# Patient Record
Sex: Female | Born: 1990 | Race: Black or African American | Hispanic: No | Marital: Single | Smoking: Never smoker
Health system: Southern US, Community
[De-identification: ages and names within clinical notes are randomized; demographics above are authoritative.]

## PROBLEM LIST (undated history)

## (undated) DIAGNOSIS — I38 Endocarditis, valve unspecified: Secondary | ICD-10-CM

## (undated) HISTORY — PX: CHOLECYSTECTOMY: SHX55

---
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
## 2008-04-03 ENCOUNTER — Observation Stay: Payer: Self-pay | Admitting: Obstetrics and Gynecology

## 2008-05-31 ENCOUNTER — Encounter: Payer: Self-pay | Admitting: Maternal & Fetal Medicine

## 2008-06-07 ENCOUNTER — Encounter: Payer: Self-pay | Admitting: Maternal & Fetal Medicine

## 2008-06-09 ENCOUNTER — Inpatient Hospital Stay: Payer: Self-pay | Admitting: Obstetrics and Gynecology

## 2008-09-22 ENCOUNTER — Emergency Department: Payer: Self-pay | Admitting: Emergency Medicine

## 2009-07-06 ENCOUNTER — Observation Stay: Payer: Self-pay

## 2009-07-14 ENCOUNTER — Encounter: Payer: Self-pay | Admitting: Obstetrics & Gynecology

## 2009-08-25 ENCOUNTER — Encounter: Payer: Self-pay | Admitting: Obstetrics & Gynecology

## 2009-09-23 ENCOUNTER — Encounter: Payer: Self-pay | Admitting: Obstetrics & Gynecology

## 2009-11-09 ENCOUNTER — Ambulatory Visit: Payer: Self-pay | Admitting: Obstetrics & Gynecology

## 2009-11-10 ENCOUNTER — Inpatient Hospital Stay: Payer: Self-pay

## 2012-07-17 ENCOUNTER — Emergency Department: Payer: Self-pay | Admitting: Emergency Medicine

## 2012-07-17 LAB — CBC
HGB: 13.1 g/dL (ref 12.0–16.0)
MCH: 30 pg (ref 26.0–34.0)
MCV: 89 fL (ref 80–100)
Platelet: 300 10*3/uL (ref 150–440)
RBC: 4.38 10*6/uL (ref 3.80–5.20)
RDW: 13.3 % (ref 11.5–14.5)
WBC: 8.8 10*3/uL (ref 3.6–11.0)

## 2012-07-17 LAB — URINALYSIS, COMPLETE
Bilirubin,UR: NEGATIVE
Ketone: NEGATIVE
Nitrite: POSITIVE
Protein: 30
Specific Gravity: 1.02 (ref 1.003–1.030)
WBC UR: 19 /HPF (ref 0–5)

## 2012-07-17 LAB — BASIC METABOLIC PANEL
BUN: 5 mg/dL — ABNORMAL LOW (ref 7–18)
Chloride: 109 mmol/L — ABNORMAL HIGH (ref 98–107)
Creatinine: 0.82 mg/dL (ref 0.60–1.30)
EGFR (African American): >60
Glucose: 94 mg/dL (ref 65–99)
Potassium: 3.6 mmol/L (ref 3.5–5.1)
Sodium: 144 mmol/L (ref 136–145)

## 2012-07-17 LAB — PREGNANCY, URINE: Pregnancy Test, Urine: NEGATIVE m[IU]/mL

## 2013-03-24 ENCOUNTER — Inpatient Hospital Stay: Payer: Self-pay | Admitting: Surgery

## 2013-03-24 LAB — CBC
HCT: 36.4 % (ref 35.0–47.0)
HGB: 12.2 g/dL (ref 12.0–16.0)
MCH: 30.1 pg (ref 26.0–34.0)
MCHC: 33.5 g/dL (ref 32.0–36.0)
MCV: 90 fL (ref 80–100)
RBC: 4.05 10*6/uL (ref 3.80–5.20)
RDW: 13.4 % (ref 11.5–14.5)

## 2013-03-24 LAB — BASIC METABOLIC PANEL
Anion Gap: 9 (ref 7–16)
BUN: 8 mg/dL (ref 7–18)
Calcium, Total: 8.3 mg/dL — ABNORMAL LOW (ref 8.5–10.1)
Co2: 25 mmol/L (ref 21–32)
Creatinine: 0.7 mg/dL (ref 0.60–1.30)
EGFR (African American): >60
Osmolality: 280 (ref 275–301)

## 2013-03-24 LAB — URINALYSIS, COMPLETE
Bilirubin,UR: NEGATIVE
Ketone: NEGATIVE
Protein: 30
Squamous Epithelial: 2
WBC UR: 20 /HPF (ref 0–5)

## 2013-03-24 LAB — HEPATIC FUNCTION PANEL A (ARMC)
Bilirubin, Direct: <0.1 mg/dL (ref 0.00–0.20)
Bilirubin,Total: 0.2 mg/dL (ref 0.2–1.0)
SGPT (ALT): 21 U/L (ref 12–78)
Total Protein: 7.1 g/dL (ref 6.4–8.2)

## 2013-03-24 LAB — TROPONIN I: Troponin-I: <0.02 ng/mL

## 2013-03-25 LAB — CBC WITH DIFFERENTIAL/PLATELET
HCT: 34.2 % — ABNORMAL LOW (ref 35.0–47.0)
HGB: 11.3 g/dL — ABNORMAL LOW (ref 12.0–16.0)
Lymphocyte #: 2.8 10*3/uL (ref 1.0–3.6)
Lymphocyte %: 38.1 %
MCH: 30 pg (ref 26.0–34.0)
MCV: 91 fL (ref 80–100)
Monocyte %: 6.9 %
Neutrophil #: 4 10*3/uL (ref 1.4–6.5)
Platelet: 294 10*3/uL (ref 150–440)
RBC: 3.76 10*6/uL — ABNORMAL LOW (ref 3.80–5.20)
RDW: 13 % (ref 11.5–14.5)
WBC: 7.5 10*3/uL (ref 3.6–11.0)

## 2013-03-25 LAB — COMPREHENSIVE METABOLIC PANEL
Albumin: 2.8 g/dL — ABNORMAL LOW (ref 3.4–5.0)
Anion Gap: 8 (ref 7–16)
BUN: 5 mg/dL — ABNORMAL LOW (ref 7–18)
Bilirubin,Total: 0.4 mg/dL (ref 0.2–1.0)
Calcium, Total: 8.2 mg/dL — ABNORMAL LOW (ref 8.5–10.1)
Co2: 26 mmol/L (ref 21–32)
Creatinine: 0.84 mg/dL (ref 0.60–1.30)
Osmolality: 276 (ref 275–301)
SGOT(AST): 20 U/L (ref 15–37)
SGPT (ALT): 22 U/L (ref 12–78)
Sodium: 140 mmol/L (ref 136–145)
Total Protein: 6.2 g/dL — ABNORMAL LOW (ref 6.4–8.2)

## 2013-03-25 LAB — PATHOLOGY REPORT

## 2013-05-02 ENCOUNTER — Emergency Department: Payer: Self-pay | Admitting: Emergency Medicine

## 2013-05-02 LAB — URINALYSIS, COMPLETE
Bacteria: NONE SEEN
Bilirubin,UR: NEGATIVE
Nitrite: NEGATIVE
Protein: 100
RBC,UR: 4792 /HPF (ref 0–5)
Specific Gravity: 1.023 (ref 1.003–1.030)
WBC UR: NONE SEEN /HPF (ref 0–5)

## 2013-05-02 LAB — CBC
HCT: 35.6 % (ref 35.0–47.0)
HGB: 12.2 g/dL (ref 12.0–16.0)
MCH: 30.2 pg (ref 26.0–34.0)
Platelet: 318 10*3/uL (ref 150–440)
WBC: 7.6 10*3/uL (ref 3.6–11.0)

## 2013-05-21 ENCOUNTER — Emergency Department: Payer: Self-pay | Admitting: Emergency Medicine

## 2013-05-21 LAB — COMPREHENSIVE METABOLIC PANEL
Albumin: 3.3 g/dL — ABNORMAL LOW (ref 3.4–5.0)
Alkaline Phosphatase: 59 U/L (ref 50–136)
Bilirubin,Total: 0.3 mg/dL (ref 0.2–1.0)
Co2: 26 mmol/L (ref 21–32)
Creatinine: 0.53 mg/dL — ABNORMAL LOW (ref 0.60–1.30)
EGFR (African American): >60
EGFR (Non-African Amer.): >60
Glucose: 87 mg/dL (ref 65–99)
Potassium: 3.6 mmol/L (ref 3.5–5.1)
SGPT (ALT): 28 U/L (ref 12–78)
Sodium: 136 mmol/L (ref 136–145)
Total Protein: 7.5 g/dL (ref 6.4–8.2)

## 2013-05-21 LAB — CBC
HCT: 36.7 % (ref 35.0–47.0)
MCH: 30.6 pg (ref 26.0–34.0)
MCHC: 34.5 g/dL (ref 32.0–36.0)
RBC: 4.14 10*6/uL (ref 3.80–5.20)

## 2013-05-21 LAB — URINALYSIS, COMPLETE
Blood: NEGATIVE
Glucose,UR: NEGATIVE mg/dL (ref 0–75)
Ketone: NEGATIVE
Protein: NEGATIVE
RBC,UR: 3 /HPF (ref 0–5)
Specific Gravity: 1.027 (ref 1.003–1.030)
Squamous Epithelial: 4
WBC UR: 5 /HPF (ref 0–5)

## 2013-05-21 LAB — HCG, QUANTITATIVE, PREGNANCY: Beta Hcg, Quant.: 91313 m[IU]/mL — ABNORMAL HIGH

## 2013-07-31 ENCOUNTER — Emergency Department: Payer: Self-pay | Admitting: Emergency Medicine

## 2013-07-31 LAB — COMPREHENSIVE METABOLIC PANEL
Alkaline Phosphatase: 75 U/L (ref 50–136)
Anion Gap: 6 — ABNORMAL LOW (ref 7–16)
Chloride: 107 mmol/L (ref 98–107)
Co2: 26 mmol/L (ref 21–32)
EGFR (African American): >60
EGFR (Non-African Amer.): >60
Glucose: 88 mg/dL (ref 65–99)
Osmolality: 275 (ref 275–301)
SGOT(AST): 29 U/L (ref 15–37)
Sodium: 139 mmol/L (ref 136–145)
Total Protein: 7 g/dL (ref 6.4–8.2)

## 2013-07-31 LAB — URINALYSIS, COMPLETE
Bilirubin,UR: NEGATIVE
Blood: NEGATIVE
Glucose,UR: NEGATIVE mg/dL (ref 0–75)
Ketone: NEGATIVE
Nitrite: NEGATIVE
Ph: 5 (ref 4.5–8.0)
Protein: 30
Specific Gravity: 1.029 (ref 1.003–1.030)
WBC UR: 5 /HPF (ref 0–5)

## 2013-07-31 LAB — CBC
HCT: 34.9 % — ABNORMAL LOW (ref 35.0–47.0)
HGB: 12.4 g/dL (ref 12.0–16.0)
MCV: 88 fL (ref 80–100)
RBC: 3.97 10*6/uL (ref 3.80–5.20)
RDW: 13.1 % (ref 11.5–14.5)
WBC: 9.8 10*3/uL (ref 3.6–11.0)

## 2013-07-31 LAB — WET PREP, GENITAL

## 2013-08-19 ENCOUNTER — Observation Stay: Payer: Self-pay

## 2013-09-29 ENCOUNTER — Observation Stay: Payer: Self-pay | Admitting: Obstetrics and Gynecology

## 2013-09-29 LAB — CBC
HCT: 33 % — ABNORMAL LOW (ref 35.0–47.0)
HGB: 11.7 g/dL — ABNORMAL LOW (ref 12.0–16.0)
MCV: 86 fL (ref 80–100)
Platelet: 281 10*3/uL (ref 150–440)
RBC: 3.83 10*6/uL (ref 3.80–5.20)
RDW: 12.9 % (ref 11.5–14.5)

## 2013-09-29 LAB — COMPREHENSIVE METABOLIC PANEL
Albumin: 2.7 g/dL — ABNORMAL LOW (ref 3.4–5.0)
Alkaline Phosphatase: 106 U/L (ref 50–136)
Anion Gap: 6 — ABNORMAL LOW (ref 7–16)
BUN: 3 mg/dL — ABNORMAL LOW (ref 7–18)
Bilirubin,Total: 0.4 mg/dL (ref 0.2–1.0)
Calcium, Total: 8.7 mg/dL (ref 8.5–10.1)
Chloride: 106 mmol/L (ref 98–107)
Co2: 23 mmol/L (ref 21–32)
EGFR (African American): >60
EGFR (Non-African Amer.): >60
Glucose: 81 mg/dL (ref 65–99)
Osmolality: 266 (ref 275–301)
SGOT(AST): 23 U/L (ref 15–37)
SGPT (ALT): 18 U/L (ref 12–78)
Total Protein: 6.6 g/dL (ref 6.4–8.2)

## 2013-09-29 LAB — URINALYSIS, COMPLETE
Bacteria: NONE SEEN
Bilirubin,UR: NEGATIVE
Blood: NEGATIVE
Glucose,UR: NEGATIVE mg/dL (ref 0–75)
RBC,UR: <1 /HPF (ref 0–5)
Specific Gravity: 1.002 (ref 1.003–1.030)
Squamous Epithelial: 1

## 2013-11-04 ENCOUNTER — Observation Stay: Payer: Self-pay

## 2013-11-04 LAB — URINALYSIS, COMPLETE
Blood: NEGATIVE
Protein: NEGATIVE
RBC,UR: NONE SEEN /HPF (ref 0–5)
Specific Gravity: 1.002 (ref 1.003–1.030)
WBC UR: <1 /HPF (ref 0–5)

## 2013-11-18 ENCOUNTER — Observation Stay: Payer: Self-pay

## 2013-11-18 LAB — URINALYSIS, COMPLETE
Bacteria: NONE SEEN
Glucose,UR: NEGATIVE mg/dL (ref 0–75)
Ketone: NEGATIVE
Leukocyte Esterase: NEGATIVE
Nitrite: NEGATIVE
Ph: 7 (ref 4.5–8.0)
Protein: NEGATIVE
Specific Gravity: 1.009 (ref 1.003–1.030)

## 2013-12-10 ENCOUNTER — Observation Stay: Payer: Self-pay | Admitting: Obstetrics and Gynecology

## 2013-12-16 ENCOUNTER — Observation Stay: Payer: Self-pay

## 2013-12-16 ENCOUNTER — Ambulatory Visit: Payer: Self-pay | Admitting: Obstetrics and Gynecology

## 2013-12-16 LAB — CBC WITH DIFFERENTIAL/PLATELET
Basophil #: 0 10*3/uL (ref 0.0–0.1)
Basophil %: 0.4 %
Eosinophil #: 0 10*3/uL (ref 0.0–0.7)
HGB: 11.7 g/dL — ABNORMAL LOW (ref 12.0–16.0)
Lymphocyte #: 1.4 10*3/uL (ref 1.0–3.6)
Lymphocyte %: 19.6 %
MCV: 83 fL (ref 80–100)
Monocyte #: 0.6 x10 3/mm (ref 0.2–0.9)
Monocyte %: 7.6 %
Neutrophil %: 71.8 %

## 2013-12-17 ENCOUNTER — Inpatient Hospital Stay: Payer: Self-pay | Admitting: Obstetrics and Gynecology

## 2013-12-18 LAB — HEMATOCRIT
HCT: 19.5 % — ABNORMAL LOW (ref 35.0–47.0)
HCT: 26.3 % — ABNORMAL LOW (ref 35.0–47.0)

## 2013-12-19 LAB — CBC
MCH: 28.5 pg (ref 26.0–34.0)
WBC: 12.9 10*3/uL — ABNORMAL HIGH (ref 3.6–11.0)

## 2013-12-20 LAB — GC/CHLAMYDIA PROBE AMP

## 2013-12-24 HISTORY — PX: EXPLORATORY LAPAROTOMY: SUR591

## 2014-01-04 LAB — CBC WITH DIFFERENTIAL/PLATELET
BASOS PCT: 0.4 %
Basophil #: 0.1 10*3/uL (ref 0.0–0.1)
Eosinophil #: 0 10*3/uL (ref 0.0–0.7)
Eosinophil %: 0.1 %
HCT: 29 % — ABNORMAL LOW (ref 35.0–47.0)
HGB: 9.5 g/dL — AB (ref 12.0–16.0)
LYMPHS ABS: 1.1 10*3/uL (ref 1.0–3.6)
Lymphocyte %: 6.9 %
MCH: 28.1 pg (ref 26.0–34.0)
MCHC: 32.9 g/dL (ref 32.0–36.0)
MCV: 86 fL (ref 80–100)
Monocyte #: 0.7 x10 3/mm (ref 0.2–0.9)
Monocyte %: 4.2 %
Neutrophil #: 14.5 10*3/uL — ABNORMAL HIGH (ref 1.4–6.5)
Neutrophil %: 88.4 %
PLATELETS: 408 10*3/uL (ref 150–440)
RBC: 3.39 10*6/uL — ABNORMAL LOW (ref 3.80–5.20)
RDW: 14.3 % (ref 11.5–14.5)
WBC: 16.4 10*3/uL — ABNORMAL HIGH (ref 3.6–11.0)

## 2014-01-04 LAB — PROTIME-INR
INR: 1.4
Prothrombin Time: 16.7 secs — ABNORMAL HIGH (ref 11.5–14.7)

## 2014-01-04 LAB — CBC
HCT: 26.5 % — ABNORMAL LOW (ref 35.0–47.0)
HGB: 9 g/dL — AB (ref 12.0–16.0)
MCH: 28.5 pg (ref 26.0–34.0)
MCHC: 33.9 g/dL (ref 32.0–36.0)
MCV: 84 fL (ref 80–100)
Platelet: 539 10*3/uL — ABNORMAL HIGH (ref 150–440)
RBC: 3.16 10*6/uL — AB (ref 3.80–5.20)
RDW: 14.1 % (ref 11.5–14.5)
WBC: 9.8 10*3/uL (ref 3.6–11.0)

## 2014-01-04 LAB — BASIC METABOLIC PANEL
Anion Gap: 4 — ABNORMAL LOW (ref 7–16)
BUN: 9 mg/dL (ref 7–18)
CREATININE: 0.93 mg/dL (ref 0.60–1.30)
Calcium, Total: 8.3 mg/dL — ABNORMAL LOW (ref 8.5–10.1)
Chloride: 109 mmol/L — ABNORMAL HIGH (ref 98–107)
Co2: 27 mmol/L (ref 21–32)
EGFR (African American): >60
EGFR (Non-African Amer.): >60
Glucose: 114 mg/dL — ABNORMAL HIGH (ref 65–99)
OSMOLALITY: 279 (ref 275–301)
POTASSIUM: 3.6 mmol/L (ref 3.5–5.1)
SODIUM: 140 mmol/L (ref 136–145)

## 2014-01-04 LAB — APTT: Activated PTT: 30.8 secs (ref 23.6–35.9)

## 2014-01-05 ENCOUNTER — Encounter (HOSPITAL_COMMUNITY): Payer: Self-pay | Admitting: Family Medicine

## 2014-01-05 ENCOUNTER — Inpatient Hospital Stay: Payer: Self-pay | Admitting: Obstetrics & Gynecology

## 2014-01-05 ENCOUNTER — Inpatient Hospital Stay (HOSPITAL_COMMUNITY): Payer: Medicaid Other

## 2014-01-05 ENCOUNTER — Inpatient Hospital Stay (HOSPITAL_COMMUNITY)
Admission: EM | Admit: 2014-01-05 | Discharge: 2014-01-09 | DRG: 769 | Disposition: A | Discharge status: Home / Self Care | Payer: Medicaid Other | Source: Other Acute Inpatient Hospital | Attending: Obstetrics & Gynecology | Admitting: Obstetrics & Gynecology

## 2014-01-05 DIAGNOSIS — I38 Endocarditis, valve unspecified: Secondary | ICD-10-CM | POA: Insufficient documentation

## 2014-01-05 DIAGNOSIS — Z6838 Body mass index (BMI) 38.0-38.9, adult: Secondary | ICD-10-CM

## 2014-01-05 DIAGNOSIS — R578 Other shock: Secondary | ICD-10-CM | POA: Diagnosis present

## 2014-01-05 DIAGNOSIS — S3140XA Unspecified open wound of vagina and vulva, initial encounter: Secondary | ICD-10-CM | POA: Diagnosis present

## 2014-01-05 DIAGNOSIS — D65 Disseminated intravascular coagulation [defibrination syndrome]: Secondary | ICD-10-CM | POA: Diagnosis present

## 2014-01-05 DIAGNOSIS — N179 Acute kidney failure, unspecified: Secondary | ICD-10-CM | POA: Diagnosis not present

## 2014-01-05 DIAGNOSIS — X58XXXA Exposure to other specified factors, initial encounter: Secondary | ICD-10-CM | POA: Diagnosis present

## 2014-01-05 DIAGNOSIS — E669 Obesity, unspecified: Secondary | ICD-10-CM | POA: Diagnosis present

## 2014-01-05 DIAGNOSIS — G8918 Other acute postprocedural pain: Secondary | ICD-10-CM | POA: Diagnosis not present

## 2014-01-05 DIAGNOSIS — S3760XA Unspecified injury of uterus, initial encounter: Secondary | ICD-10-CM | POA: Diagnosis present

## 2014-01-05 DIAGNOSIS — G9340 Encephalopathy, unspecified: Secondary | ICD-10-CM | POA: Diagnosis present

## 2014-01-05 DIAGNOSIS — E872 Acidosis, unspecified: Secondary | ICD-10-CM | POA: Diagnosis not present

## 2014-01-05 DIAGNOSIS — R000 Tachycardia, unspecified: Secondary | ICD-10-CM | POA: Diagnosis present

## 2014-01-05 DIAGNOSIS — R58 Hemorrhage, not elsewhere classified: Secondary | ICD-10-CM | POA: Diagnosis present

## 2014-01-05 DIAGNOSIS — K661 Hemoperitoneum: Secondary | ICD-10-CM | POA: Diagnosis present

## 2014-01-05 DIAGNOSIS — J9600 Acute respiratory failure, unspecified whether with hypoxia or hypercapnia: Secondary | ICD-10-CM

## 2014-01-05 DIAGNOSIS — D62 Acute posthemorrhagic anemia: Secondary | ICD-10-CM | POA: Diagnosis present

## 2014-01-05 DIAGNOSIS — K683 Retroperitoneal hematoma: Secondary | ICD-10-CM | POA: Diagnosis present

## 2014-01-05 HISTORY — DX: Endocarditis, valve unspecified: I38

## 2014-01-05 LAB — URINALYSIS, ROUTINE W REFLEX MICROSCOPIC
Bilirubin Urine: NEGATIVE
Glucose, UA: NEGATIVE mg/dL
HGB URINE DIPSTICK: NEGATIVE
Ketones, ur: NEGATIVE mg/dL
NITRITE: NEGATIVE
Protein, ur: NEGATIVE mg/dL
SPECIFIC GRAVITY, URINE: 1.022 (ref 1.005–1.030)
Urobilinogen, UA: 0.2 mg/dL (ref 0.0–1.0)
pH: 5.5 (ref 5.0–8.0)

## 2014-01-05 LAB — LACTIC ACID, PLASMA: LACTIC ACID, VENOUS: 1.6 mmol/L (ref 0.5–2.2)

## 2014-01-05 LAB — CBC
HCT: 16 % — ABNORMAL LOW (ref 36.0–46.0)
HEMATOCRIT: 20.8 % — AB (ref 36.0–46.0)
Hemoglobin: 5.7 g/dL — CL (ref 12.0–15.0)
Hemoglobin: 7.4 g/dL — ABNORMAL LOW (ref 12.0–15.0)
MCH: 28.1 pg (ref 26.0–34.0)
MCH: 27.6 pg (ref 26.0–34.0)
MCHC: 35.6 g/dL (ref 30.0–36.0)
MCHC: 35.6 g/dL (ref 30.0–36.0)
MCV: 78.8 fL (ref 78.0–100.0)
MCV: 77.6 fL — AB (ref 78.0–100.0)
PLATELETS: 148 10*3/uL — AB (ref 150–400)
Platelets: 159 10*3/uL (ref 150–400)
RBC: 2.03 MIL/uL — ABNORMAL LOW (ref 3.87–5.11)
RBC: 2.68 MIL/uL — AB (ref 3.87–5.11)
RDW: 16.9 % — AB (ref 11.5–15.5)
RDW: 15.8 % — ABNORMAL HIGH (ref 11.5–15.5)
WBC: 11 10*3/uL — AB (ref 4.0–10.5)
WBC: 12.9 10*3/uL — ABNORMAL HIGH (ref 4.0–10.5)

## 2014-01-05 LAB — HEPATIC FUNCTION PANEL A (ARMC)
ALT: 21 U/L (ref 12–78)
AST: 31 U/L (ref 15–37)
Albumin: 2 g/dL — ABNORMAL LOW (ref 3.4–5.0)
Alkaline Phosphatase: 47 U/L
BILIRUBIN DIRECT: 0.3 mg/dL — AB (ref 0.00–0.20)
Bilirubin,Total: 0.8 mg/dL (ref 0.2–1.0)
TOTAL PROTEIN: 4.5 g/dL — AB (ref 6.4–8.2)

## 2014-01-05 LAB — CBC WITH DIFFERENTIAL/PLATELET
BASOS PCT: 0.1 %
Basophil #: 0 10*3/uL (ref 0.0–0.1)
EOS PCT: 0 %
Eosinophil #: 0 10*3/uL (ref 0.0–0.7)
HCT: 12.4 % — CL (ref 35.0–47.0)
HGB: 4.2 g/dL — AB (ref 12.0–16.0)
LYMPHS ABS: 1.3 10*3/uL (ref 1.0–3.6)
LYMPHS PCT: 9.4 %
MCH: 28.9 pg (ref 26.0–34.0)
MCHC: 34.3 g/dL (ref 32.0–36.0)
MCV: 84 fL (ref 80–100)
Monocyte #: 1 x10 3/mm — ABNORMAL HIGH (ref 0.2–0.9)
Monocyte %: 7.6 %
NEUTROS ABS: 11.1 10*3/uL — AB (ref 1.4–6.5)
NEUTROS PCT: 82.9 %
PLATELETS: 235 10*3/uL (ref 150–440)
RBC: 1.47 10*6/uL — AB (ref 3.80–5.20)
RDW: 13.7 % (ref 11.5–14.5)
WBC: 13.4 10*3/uL — ABNORMAL HIGH (ref 3.6–11.0)

## 2014-01-05 LAB — BASIC METABOLIC PANEL
Anion Gap: 6 — ABNORMAL LOW (ref 7–16)
Anion Gap: 9 (ref 7–16)
BUN: 9 mg/dL (ref 7–18)
BUN: 10 mg/dL (ref 7–18)
CHLORIDE: 114 mmol/L — AB (ref 98–107)
CHLORIDE: 114 mmol/L — AB (ref 98–107)
CREATININE: 0.88 mg/dL (ref 0.60–1.30)
Calcium, Total: 7.1 mg/dL — ABNORMAL LOW (ref 8.5–10.1)
Calcium, Total: 6.7 mg/dL — CL (ref 8.5–10.1)
Co2: 23 mmol/L (ref 21–32)
Co2: 19 mmol/L — ABNORMAL LOW (ref 21–32)
Creatinine: 0.73 mg/dL (ref 0.60–1.30)
EGFR (African American): >60
EGFR (African American): >60
EGFR (Non-African Amer.): >60
Glucose: 108 mg/dL — ABNORMAL HIGH (ref 65–99)
Glucose: 178 mg/dL — ABNORMAL HIGH (ref 65–99)
OSMOLALITY: 284 (ref 275–301)
OSMOLALITY: 287 (ref 275–301)
POTASSIUM: 4.2 mmol/L (ref 3.5–5.1)
Potassium: 4.7 mmol/L (ref 3.5–5.1)
Sodium: 143 mmol/L (ref 136–145)
Sodium: 142 mmol/L (ref 136–145)

## 2014-01-05 LAB — PROTIME-INR
INR: 1.7
INR: 1.3
INR: 1.42 (ref 0.00–1.49)
Prothrombin Time: 19.4 secs — ABNORMAL HIGH (ref 11.5–14.7)
Prothrombin Time: 16.3 secs — ABNORMAL HIGH (ref 11.5–14.7)
Prothrombin Time: 17 seconds — ABNORMAL HIGH (ref 11.6–15.2)

## 2014-01-05 LAB — URINALYSIS, COMPLETE
BILIRUBIN, UR: NEGATIVE
BLOOD: NEGATIVE
Glucose,UR: NEGATIVE mg/dL (ref 0–75)
Ketone: NEGATIVE
Leukocyte Esterase: NEGATIVE
NITRITE: NEGATIVE
Ph: 5 (ref 4.5–8.0)
Protein: NEGATIVE
RBC,UR: <1 /HPF (ref 0–5)
Specific Gravity: 1.019 (ref 1.003–1.030)
Squamous Epithelial: NONE SEEN
WBC UR: 1 /HPF (ref 0–5)

## 2014-01-05 LAB — FIBRINOGEN
FIBRINOGEN: 181 mg/dL — AB (ref 210–470)
Fibrinogen: 260 mg/dL (ref 204–475)

## 2014-01-05 LAB — COMPREHENSIVE METABOLIC PANEL
ALT: 13 U/L (ref 0–35)
AST: 21 U/L (ref 0–37)
Albumin: 1.7 g/dL — ABNORMAL LOW (ref 3.5–5.2)
Alkaline Phosphatase: 43 U/L (ref 39–117)
BILIRUBIN TOTAL: 0.6 mg/dL (ref 0.3–1.2)
BUN: 9 mg/dL (ref 6–23)
CHLORIDE: 109 meq/L (ref 96–112)
CO2: 18 meq/L — AB (ref 19–32)
Calcium: 6.2 mg/dL — CL (ref 8.4–10.5)
Creatinine, Ser: 1.06 mg/dL (ref 0.50–1.10)
GFR calc non Af Amer: 73 mL/min — ABNORMAL LOW (ref >90)
GFR, EST AFRICAN AMERICAN: 84 mL/min — AB (ref >90)
GLUCOSE: 132 mg/dL — AB (ref 70–99)
POTASSIUM: 4.4 meq/L (ref 3.7–5.3)
SODIUM: 137 meq/L (ref 137–147)
Total Protein: 3.7 g/dL — ABNORMAL LOW (ref 6.0–8.3)

## 2014-01-05 LAB — POCT I-STAT 3, ART BLOOD GAS (G3+)
Acid-base deficit: 8 mmol/L — ABNORMAL HIGH (ref 0.0–2.0)
Bicarbonate: 17.3 mEq/L — ABNORMAL LOW (ref 20.0–24.0)
O2 SAT: 99 %
PH ART: 7.349 — AB (ref 7.350–7.450)
TCO2: 18 mmol/L (ref 0–100)
pCO2 arterial: 31.3 mmHg — ABNORMAL LOW (ref 35.0–45.0)
pO2, Arterial: 160 mmHg — ABNORMAL HIGH (ref 80.0–100.0)

## 2014-01-05 LAB — PREPARE RBC (CROSSMATCH)

## 2014-01-05 LAB — GLUCOSE, CAPILLARY
GLUCOSE-CAPILLARY: 116 mg/dL — AB (ref 70–99)
GLUCOSE-CAPILLARY: 125 mg/dL — AB (ref 70–99)
Glucose-Capillary: 112 mg/dL — ABNORMAL HIGH (ref 70–99)

## 2014-01-05 LAB — URINE MICROSCOPIC-ADD ON

## 2014-01-05 LAB — ALBUMIN: Albumin: 1.5 g/dL — ABNORMAL LOW (ref 3.4–5.0)

## 2014-01-05 LAB — PHOSPHORUS: Phosphorus: 3.9 mg/dL (ref 2.3–4.6)

## 2014-01-05 LAB — APTT
Activated PTT: 37.2 secs — ABNORMAL HIGH (ref 23.6–35.9)
aPTT: 29 seconds (ref 24–37)

## 2014-01-05 LAB — MAGNESIUM
MAGNESIUM: 1 mg/dL — AB
Magnesium: 3.2 mg/dL — ABNORMAL HIGH (ref 1.5–2.5)

## 2014-01-05 LAB — FIBRIN DEGRADATION PROD.(ARMC ONLY)

## 2014-01-05 LAB — MRSA PCR SCREENING: MRSA BY PCR: NEGATIVE

## 2014-01-05 LAB — ABO/RH: ABO/RH(D): B POS

## 2014-01-05 MED ORDER — FENTANYL CITRATE 0.05 MG/ML IJ SOLN
50.0000 ug | Freq: Once | INTRAMUSCULAR | Status: DC
  Filled 2014-01-05: qty 2

## 2014-01-05 MED ORDER — PROPOFOL 10 MG/ML IV EMUL
INTRAVENOUS | Status: AC
  Administered 2014-01-05: 5 ug/kg/min
  Filled 2014-01-05: qty 100

## 2014-01-05 MED ORDER — IOHEXOL 300 MG/ML  SOLN
150.0000 mL | Freq: Once | INTRAMUSCULAR | Status: AC | PRN
  Administered 2014-01-05: 185 mL via INTRA_ARTERIAL

## 2014-01-05 MED ORDER — PROPOFOL 10 MG/ML IV EMUL
0.0000 ug/kg/min | INTRAVENOUS | Status: DC
  Administered 2014-01-06: 5 ug/kg/min via INTRAVENOUS
  Administered 2014-01-06: 10 ug/kg/min via INTRAVENOUS
  Filled 2014-01-05: qty 100

## 2014-01-05 MED ORDER — FENTANYL BOLUS VIA INFUSION
25.0000 ug | INTRAVENOUS | Status: DC | PRN
  Administered 2014-01-08: 50 ug via INTRAVENOUS
  Filled 2014-01-05: qty 50

## 2014-01-05 MED ORDER — FENTANYL CITRATE 0.05 MG/ML IJ SOLN
0.0000 ug/h | INTRAMUSCULAR | Status: DC
  Administered 2014-01-05: 400 ug/h via INTRAVENOUS
  Administered 2014-01-06: 50 ug/h via INTRAVENOUS
  Administered 2014-01-06: 25 ug/h via INTRAVENOUS
  Administered 2014-01-06: 35 ug/h via INTRAVENOUS
  Filled 2014-01-05 (×3): qty 50

## 2014-01-05 MED ORDER — MIDAZOLAM HCL 2 MG/2ML IJ SOLN
INTRAMUSCULAR | Status: AC
  Filled 2014-01-05: qty 4

## 2014-01-05 MED ORDER — INSULIN ASPART 100 UNIT/ML ~~LOC~~ SOLN
0.0000 [IU] | SUBCUTANEOUS | Status: DC
  Administered 2014-01-06: 1 [IU] via SUBCUTANEOUS

## 2014-01-05 MED ORDER — BIOTENE DRY MOUTH MT LIQD
15.0000 mL | Freq: Four times a day (QID) | OROMUCOSAL | Status: DC
  Administered 2014-01-06 – 2014-01-07 (×6): 15 mL via OROMUCOSAL

## 2014-01-05 MED ORDER — SODIUM CHLORIDE 0.9 % IV SOLN: 250.0000 mL | INTRAVENOUS | Status: DC | PRN

## 2014-01-05 MED ORDER — PANTOPRAZOLE SODIUM 40 MG IV SOLR
40.0000 mg | INTRAVENOUS | Status: DC
  Filled 2014-01-05 (×2): qty 40

## 2014-01-05 MED ORDER — CHLORHEXIDINE GLUCONATE 0.12 % MT SOLN
15.0000 mL | Freq: Two times a day (BID) | OROMUCOSAL | Status: DC
  Administered 2014-01-06 (×3): 15 mL via OROMUCOSAL
  Filled 2014-01-05 (×2): qty 15

## 2014-01-05 MED ORDER — MIDAZOLAM HCL 2 MG/2ML IJ SOLN
INTRAMUSCULAR | Status: AC | PRN
  Administered 2014-01-05 (×2): 0.5 mg via INTRAVENOUS
  Administered 2014-01-05: 1 mg via INTRAVENOUS
  Administered 2014-01-05: 0.5 mg via INTRAVENOUS
  Administered 2014-01-05: 1 mg via INTRAVENOUS
  Administered 2014-01-05: 0.5 mg via INTRAVENOUS

## 2014-01-05 NOTE — ED Notes (Signed)
Pt on Propofol and fentanyl gtts NGT to [facility]

## 2014-01-05 NOTE — Procedures (Signed)
Post technically successful right sided uterine artery coil, gel foam and embosphere embolization for active uterine extravasation.  A definite left sided uterine artery was not identified and there was no definitive supply to the area of extravasation from the left hemipelvis.  No immediate complications.  Keep left leg straight for 2 hrs.

## 2014-01-05 NOTE — H&P (Signed)
Name: Erika Wright MRN: [id number] DOB: [birth year]    ADMISSION DATE:  01/05/2014 CONSULTATION DATE:  01/05/2014  REFERRING MD :  Dr. Manfred ShirtsShaw [facility]  PRIMARY SERVICE: Pulmonary/Critical Care  CHIEF COMPLAINT:  Bleeding/Unresponsiveness  BRIEF PATIENT DESCRIPTION: Obese 22y.o. African [place] female only history of "leaky heart valve" delivered by c-section 12/17/13.  Initial blood loss post-partum and was d/c'd home with crit of 24.4.  EMS called for uncontrolled bleeding 1/12. 2 D&Cs at [facility], continued bleeding despite all interventions. Tx to [facility], DIC, vent dependence and shock.  [facility]: PRBC- 6 unit ffp 4 Plat 2 1 cryo  SIGNIFICANT EVENTS / STUDIES:  1/12-To ED, uncontrolled vaginal bleeding, D&C x 2, repair uterine tearintubated, cortis placed 1/13-continued bleeding, tx to [facility]  LINES / TUBES: R femoral cortis 1/12>>> Foley 1/12>>> ETT 1/12>>>  CULTURES: Urine Cx 1/13>>>  ANTIBIOTICS: Clindamycin 1/12>> 1/13 Ampicillin 1/12>>> 1/13 Zosyn 1/13>>>   HISTORY OF PRESENT ILLNESS:   Delivered baby 12/26 by c-section.  Initial blood loss post-partum and was d/c'd home with crit of 24.4.  1/12 called EMS, thought she was urinating on herself but it was blood.  Upon EMS arrival was found unresponsive with BP 70s systolic and tachycardic.  To [facility] initial crit 26.5,given cytotec, taken for D&C x 2, received 6 blood 4 ffp,, 2 plts, 1 cryo, uterine tear with retroperitoneal bleed,  continued vaginal bleeding. D-dimer > 6.0, fibrinogen 181. [facility].  PAST MEDICAL HISTORY :  No past medical history on file. " leaky heart valve" No past surgical history on file. Prior to Admission medications   Not on File   No Known Allergies  FAMILY HISTORY:  No family history on file. SOCIAL HISTORY: No known drug use, neg etoh  REVIEW OF SYSTEMS:  Unable, intubated/sedated.  SUBJECTIVE: Intubated, tachy, continued vaginal  bleeding, still requiring pressors.  VITAL SIGNS: FiO2 (%):  [40 %] 40 % (01/13 1400) Weight:  [98.3 kg (216 lb 11.4 oz)] 98.3 kg (216 lb 11.4 oz) (01/13 1258) HEMODYNAMICS:   VENTILATOR SETTINGS: Vent Mode:  [-] PRVC FiO2 (%):  [40 %] 40 % Set Rate:  [14 bmp] 14 bmp Vt Set:  [500 mL] 500 mL PEEP:  [5 cmH20] 5 cmH20 Plateau Pressure:  [22 cmH20] 22 cmH20 INTAKE / OUTPUT: Intake/Output   None     PHYSICAL EXAMINATION: General:  Sedated, obese, minimally responsive Neuro:  Withdraws to pain, PERRLA HEENT:  ETT in place Cardiovascular:  Regular rate, tachy 110's, no murmur appreciated Lungs:  Mostly clear bilaterally, good air movement, no increased WOB Abdomen:  Distended, hypoactive bowel sounds, tender Musculoskeletal:  MAE non purposeful. Skin:  Intact, C/S site with intact dry/clean dressing  LABS:  CBC  Recent Labs Lab 01/05/14 1333  WBC 11.0*  HGB 5.7*  HCT 16.0*  PLT 148*   Coag's  Recent Labs Lab 01/05/14 1333  APTT 29  INR 1.42   BMET  Recent Labs Lab 01/05/14 1333  NA 137  K 4.4  CL 109  CO2 18*  BUN 9  CREATININE 1.06  GLUCOSE 132*   Electrolytes  Recent Labs Lab 01/05/14 1333  CALCIUM 6.2*  MG 3.2*  PHOS 3.9   Sepsis Markers  Recent Labs Lab 01/05/14 1334  LATICACIDVEN 1.6   ABG  Recent Labs Lab 01/05/14 1343  PHART 7.349*  PCO2ART 31.3*  PO2ART 160.0*   Liver Enzymes  Recent Labs Lab 01/05/14 1333  AST 21  ALT 13  ALKPHOS 43  BILITOT 0.6  ALBUMIN 1.7*   Cardiac Enzymes No results found for this basename: TROPONINI, PROBNP,  in the last 168 hours Glucose  Recent Labs Lab 01/05/14 1258  GLUCAP 116*    Imaging Dg Chest Port 1 View  01/05/2014   CLINICAL DATA:  Intubation.  EXAM: PORTABLE CHEST - 1 VIEW  COMPARISON:  Chest x-ray 01/05/2014.  FINDINGS: Endotracheal tube and NG tube in good anatomic position. Low lung volumes. No focal infiltrate. Heart size stable. No pleural effusion or pneumothorax.  No acute osseous abnormality.  IMPRESSION: Stable tube positions.  Low lung volumes.  No acute abnormality .   Electronically Signed   By: Maisie Fus  Register   On: 01/05/2014 15:12   CXR:  ett wnl, small lung volumes   ASSESSMENT / PLAN:  PULMONARY A: Unable to protect airway d/t hemorrhagic shock, high risk TRALI/ EDEMA P:   Maintain ETT and vent support.  ABg now, maintain current MV likely in am can consider weaning cpap 5 ps 5, goal 1 hr, pending resuscitation efforts Low threshold lasix with products  CARDIOVASCULAR A: Tachycardia, "Leaky heart valve", cardiomegaly P:  Echo to eval valve dysfunction and heart function, r/o post partum cardiomyoapthy Fluids and blood to resolve hypovolemia to alleviate tachycardia. Low threshold line cvp placement Cortisol, tsh Levophed to map 60 Dc propofol in setting low MAP Cbc , prbc, see heme  RENAL A:  High risk pulm edema , s/p massive Tx P:   Monitor fluid status, I & Os, and BMET. bmet Low threshold lasix, await off pressors prior  GASTROINTESTINAL A:  SUP required.  Nutrition, retroper bleed, high risk ileus  P:   Protonix daily. Nutrition consult for tube feeds, consider feed in am  Maintain NGT clamped. lft in am   HEMATOLOGIC A:  Hemmorhagic shock, continued dilutional / consumptive anemia, coagulapthy, component DIC? P:  Monitor CBC q6hrs. Give blood products as needed. Last hgb 5.7, transfuse 2 units PRBCs now. Monitor for new bleeding. SCDs for  STAT coags, fibrinogen ffp further may be required If fibrinogen less 100, add 5- 10 units cryo When we can limit products we need to limit as reduce dilutional changes  INFECTIOUS A:  Endometritis presumed P:   D/c ampicillin and clindamycin.  Begin Zosyn IV. Await urine culture. Follow path closely to ensure no retained products Will need a consult gen surgery vs OB, wound care, retroper bleed  ENDOCRINE A:  R/o rel AI P:   cortisol  NEUROLOGIC A:   Lethargy/Unresponsiveness r/t hemorrhagic shock, vent dyschrony P:   Maintain sedation at lowest rate.  Continue interventions to resolve shock.  PAD protocol, fent prefer Dc propofol WUA  TODAY'S SUMMARY: give further prbc, await labs, abg improved, hope to dc pressors  I have personally obtained a history, examined the patient, evaluated laboratory and imaging results, formulated the assessment and plan and placed orders. CRITICAL CARE: The patient is critically ill with multiple organ systems failure and requires high complexity decision making for assessment and support, frequent evaluation and titration of therapies, application of advanced monitoring technologies and extensive interpretation of multiple databases. Critical Care Time devoted to patient care services described in this note is 40 minutes.   Melissa Holmes PA-S Mcarthur Rossetti. Tyson Alias, MD, FACP Pgr: [phone] [place] [facility]  Pulmonary and Critical Care Medicine [facility] Pager: [phone]  01/05/2014, 2:50 PM

## 2014-01-05 NOTE — Procedures (Deleted)
Arterial Catheter Insertion Procedure Note Erika Wright [id number] [birth year]  Procedure: Insertion of Arterial Catheter  Indications: Blood pressure monitoring and Frequent blood sampling  Procedure Details Consent: Risks of procedure as well as the alternatives and risks of each were explained to the (patient/caregiver).  Consent for procedure obtained. Time Out: Verified patient identification, verified procedure, site/side was marked, verified correct patient position, special equipment/implants available, medications/allergies/relevent history reviewed, required imaging and test results available.  Performed  Maximum sterile technique was used including antiseptics, cap, gloves, gown, hand hygiene, mask and sheet. Skin prep: Chlorhexidine; local anesthetic administered 20 gauge catheter was inserted into right radial artery using the Seldinger technique.  Evaluation Blood flow good; BP tracing good. Complications: No apparent complications.   Nelda BucksFEINSTEIN,DANIEL J. 01/05/2014  US gudiancwe  Mcarthur Rossettianiel J. Tyson AliasFeinstein, MD, FACP Pgr: [phone] [place] [facility]

## 2014-01-05 NOTE — Procedures (Deleted)
Central Venous Catheter Insertion Procedure Note Maudry MayhewShanekia Pollio [id number] June 27, 1991  Procedure: Insertion of Central Venous Catheter Indications: Assessment of intravascular volume, Drug and/or fluid administration and Frequent blood sampling  Procedure Details Consent: Risks of procedure as well as the alternatives and risks of each were explained to the (patient/caregiver).  Consent for procedure obtained. Time Out: Verified patient identification, verified procedure, site/side was marked, verified correct patient position, special equipment/implants available, medications/allergies/relevent history reviewed, required imaging and test results available.  Performed  Maximum sterile technique was used including antiseptics, cap, gloves, gown, hand hygiene, mask and sheet. Skin prep: Chlorhexidine; local anesthetic administered A antimicrobial bonded/coated triple lumen catheter was placed in the left internal jugular vein using the Seldinger technique.  Evaluation Blood flow good Complications: No apparent complications Patient did tolerate procedure well. Chest X-ray ordered to verify placement.  CXR: pending.  Nelda BucksFEINSTEIN,Keirsten Matuska J. 01/05/2014, 2:39 PM  US guidance Tolerated well   Mcarthur Rossettianiel J. Tyson AliasFeinstein, MD, FACP Pgr: [phone] [place] [facility]

## 2014-01-05 NOTE — Consult Note (Addendum)
Recommendations: 1.  Interventional Radiology consulted for uterine artery embolization. 2.  Blood product replacement including correction of DIC. 3.  If continued vaginal bleeding despite above measures, consider having GYN Oncology consult in am vs. Transfer to [facility] this service.   Assessment: Patient Active Problem List   Diagnosis Date Noted  . Hemorrhagic shock 01/05/2014  . Retroperitoneal bleed 01/05/2014  . Hemorrhage, delayed postpartum 01/05/2014  . Heart valve problem    HPI 23 y.o. G3P3 who is 19 days s/[facility]. She had an intial hgb drop immediately pp and required 2 u PRBC's after surgery.  She was doing well at home until 2 days ago, when she developed acute onset bright red vaginal bleeding.  EMS called and found pt. Unresponsive and hypotensive.  [facility] and given tocolytics and after failing to stop bleeding, was taken to the OR and underwent D & C with Bakri balloon placement.  She received some blood products, but bleeding persisted and she was then taken to OR for possible hysterectomy.  At that time, there was no hemoperitoneum, but continued vaginal bleeding.  Noted to have hole in posterior uterus closed.  Small vaginal laceration noted and packing placed.  There was also found a large, calcified hematoma retroperitoneally, likely from initial surgery.  It was felt that surgery (hysterectomy) should not be undertaken with this finding, and inability to secure blood supply and identify ureter. She was [facility] another few hours and received a total of 9 u PRBC's, some FFP and platelets there.  Her bleeding continued and attempts to [facility] were not successful due to unavailability of ICU beds.  She was then transferred here.  She has been on pressors, which are off at the moment.  She has received another 2 u PRBC's, plts and FFP since arrival here.   She remains tachycardic and has  continued vaginal bleeding with packing in the vagina.  Past Medical History  Diagnosis Date  . Heart valve problem     Leaking   Past Surgical History  Procedure Laterality Date  . Cholecystectomy    . Cesarean section      x 3  . Exploratory laparotomy  2015   Medications Current facility-administered medications:0.9 %  sodium chloride infusion, 250 mL, Intravenous, PRN, Courtney ParisEden W Jones, MD;  fentaNYL (SUBLIMAZE) 10 mcg/mL in sodium chloride 0.9 % 250 mL infusion, 0-400 mcg/hr, Intravenous, Continuous, Nelda Bucksaniel J Feinstein, MD, Last Rate: 2.5 mL/hr at 01/05/14 1830, 25 mcg/hr at 01/05/14 1830;  fentaNYL (SUBLIMAZE) bolus via infusion 25-50 mcg, 25-50 mcg, Intravenous, Q1H PRN, Nelda Bucksaniel J Feinstein, MD fentaNYL (SUBLIMAZE) injection 50 mcg, 50 mcg, Intravenous, Once, Nelda Bucksaniel J Feinstein, MD;  insulin aspart (novoLOG) injection 0-9 Units, 0-9 Units, Subcutaneous, Q4H, Nelda Bucksaniel J Feinstein, MD;  pantoprazole (PROTONIX) injection 40 mg, 40 mg, Intravenous, Q24H, Nelda Bucksaniel J Feinstein, MD;  propofol (DIPRIVAN) 10 mg/ml infusion, 0-50 mcg/kg/min, Intravenous, Titrated, Merwyn Katosavid B Simonds, MD, Last Rate: 2.9 mL/hr at 01/05/14 1900, 5 mcg/kg/min at 01/05/14 1900  No Known Allergies  No family history on file.  History   Social History  . Marital Status: Single    Spouse Name: N/A    Number of Children: N/A  . Years of Education: N/A   Occupational History  . Not on file.   Social History Main Topics  . Smoking status: Never Smoker   . Smokeless tobacco: Not on file  .  Alcohol Use: No  . Drug Use: Not on file  . Sexual Activity: Not on file   Other Topics Concern  . Not on file   Social History Narrative  . No narrative on file   ROS: Pt is intubated and am unable to obtain a full ROS  Filed Vitals:   01/05/14 2000  BP: 88/51  Pulse: 108  Temp:   Resp: 25   Physical Exam  Vitals reviewed. Constitutional: She appears ill. She is sedated and intubated.  HENT:  Head: Normocephalic  and atraumatic.  Eyes: No scleral icterus.  Neck: Neck supple.  Cardiovascular: Tachycardia present.   Pulmonary/Chest: She is intubated.  Abdominal: Soft. There is generalized tenderness. There is guarding. There is no rebound.  Genitourinary: There is bleeding around the vagina.  Musculoskeletal: She exhibits edema.   Labs: CBC    Component Value Date/Time   WBC 12.9* 01/05/2014 2007   RBC 2.68* 01/05/2014 2007   HGB 7.4* 01/05/2014 2007   HCT 20.8* 01/05/2014 2007   PLT 159 01/05/2014 2007   MCV 77.6* 01/05/2014 2007   MCH 27.6 01/05/2014 2007   MCHC 35.6 01/05/2014 2007   RDW 15.8* 01/05/2014 2007   CMP     Component Value Date/Time   NA 137 01/05/2014 1333   K 4.4 01/05/2014 1333   CL 109 01/05/2014 1333   CO2 18* 01/05/2014 1333   GLUCOSE 132* 01/05/2014 1333   BUN 9 01/05/2014 1333   CREATININE 1.06 01/05/2014 1333   CALCIUM 6.2* 01/05/2014 1333   PROT 3.7* 01/05/2014 1333   ALBUMIN 1.7* 01/05/2014 1333   AST 21 01/05/2014 1333   ALT 13 01/05/2014 1333   ALKPHOS 43 01/05/2014 1333   BILITOT 0.6 01/05/2014 1333   GFRNONAA 73* 01/05/2014 1333   GFRAA 84* 01/05/2014 1333   Fibrinogen 260 INR 1.42 PTT 29  Thank you for sharing this consult.  We will continue to follow with you.  See above recommendations.  Please call [phone] for concerns or questions.

## 2014-01-06 ENCOUNTER — Inpatient Hospital Stay (HOSPITAL_COMMUNITY): Payer: Medicaid Other

## 2014-01-06 ENCOUNTER — Encounter (HOSPITAL_COMMUNITY): Payer: Self-pay | Admitting: *Deleted

## 2014-01-06 DIAGNOSIS — J9600 Acute respiratory failure, unspecified whether with hypoxia or hypercapnia: Secondary | ICD-10-CM

## 2014-01-06 DIAGNOSIS — R578 Other shock: Secondary | ICD-10-CM

## 2014-01-06 LAB — CBC
HCT: 20.8 % — ABNORMAL LOW (ref 36.0–46.0)
HCT: 20.9 % — ABNORMAL LOW (ref 36.0–46.0)
HCT: 18.7 % — ABNORMAL LOW (ref 36.0–46.0)
HEMATOCRIT: 18.1 % — AB (ref 36.0–46.0)
HEMOGLOBIN: 6.5 g/dL — AB (ref 12.0–15.0)
Hemoglobin: 7.4 g/dL — ABNORMAL LOW (ref 12.0–15.0)
Hemoglobin: 7.4 g/dL — ABNORMAL LOW (ref 12.0–15.0)
Hemoglobin: 6.6 g/dL — CL (ref 12.0–15.0)
MCH: 28.3 pg (ref 26.0–34.0)
MCH: 28.7 pg (ref 26.0–34.0)
MCH: 28.7 pg (ref 26.0–34.0)
MCH: 28.9 pg (ref 26.0–34.0)
MCHC: 35.9 g/dL (ref 30.0–36.0)
MCHC: 35.6 g/dL (ref 30.0–36.0)
MCHC: 35.4 g/dL (ref 30.0–36.0)
MCHC: 35.3 g/dL (ref 30.0–36.0)
MCV: 78.7 fL (ref 78.0–100.0)
MCV: 80.6 fL (ref 78.0–100.0)
MCV: 81 fL (ref 78.0–100.0)
MCV: 82 fL (ref 78.0–100.0)
PLATELETS: 139 10*3/uL — AB (ref 150–400)
PLATELETS: 153 10*3/uL (ref 150–400)
PLATELETS: 153 10*3/uL (ref 150–400)
Platelets: 148 10*3/uL — ABNORMAL LOW (ref 150–400)
RBC: 2.3 MIL/uL — ABNORMAL LOW (ref 3.87–5.11)
RBC: 2.58 MIL/uL — ABNORMAL LOW (ref 3.87–5.11)
RBC: 2.58 MIL/uL — ABNORMAL LOW (ref 3.87–5.11)
RBC: 2.28 MIL/uL — AB (ref 3.87–5.11)
RDW: 16.2 % — AB (ref 11.5–15.5)
RDW: 16.4 % — AB (ref 11.5–15.5)
RDW: 16.3 % — AB (ref 11.5–15.5)
RDW: 16.7 % — ABNORMAL HIGH (ref 11.5–15.5)
WBC: 13 10*3/uL — ABNORMAL HIGH (ref 4.0–10.5)
WBC: 13.7 10*3/uL — ABNORMAL HIGH (ref 4.0–10.5)
WBC: 15 10*3/uL — ABNORMAL HIGH (ref 4.0–10.5)
WBC: 13.7 10*3/uL — AB (ref 4.0–10.5)

## 2014-01-06 LAB — GLUCOSE, CAPILLARY
GLUCOSE-CAPILLARY: 124 mg/dL — AB (ref 70–99)
Glucose-Capillary: 103 mg/dL — ABNORMAL HIGH (ref 70–99)
Glucose-Capillary: 109 mg/dL — ABNORMAL HIGH (ref 70–99)
Glucose-Capillary: 119 mg/dL — ABNORMAL HIGH (ref 70–99)

## 2014-01-06 LAB — BLOOD GAS, ARTERIAL
ACID-BASE DEFICIT: 6.4 mmol/L — AB (ref 0.0–2.0)
ACID-BASE DEFICIT: 7.6 mmol/L — AB (ref 0.0–2.0)
BICARBONATE: 16.7 meq/L — AB (ref 20.0–24.0)
Bicarbonate: 18 mEq/L — ABNORMAL LOW (ref 20.0–24.0)
DRAWN BY: 36277
Drawn by: 39899
FIO2: 0.4 %
FIO2: 0.4 %
LHR: 14 {breaths}/min
MECHVT: 500 mL
MECHVT: 500 mL
O2 SAT: 99.3 %
O2 SAT: 99.4 %
PEEP: 5 cmH2O
PEEP: 5 cmH2O
PO2 ART: 137 mmHg — AB (ref 80.0–100.0)
Patient temperature: 98.6
Patient temperature: 98.9
RATE: 14 resp/min
TCO2: 19 mmol/L (ref 0–100)
TCO2: 17.6 mmol/L (ref 0–100)
pCO2 arterial: 32.6 mmHg — ABNORMAL LOW (ref 35.0–45.0)
pCO2 arterial: 29.7 mmHg — ABNORMAL LOW (ref 35.0–45.0)
pH, Arterial: 7.362 (ref 7.350–7.450)
pH, Arterial: 7.369 (ref 7.350–7.450)
pO2, Arterial: 153 mmHg — ABNORMAL HIGH (ref 80.0–100.0)

## 2014-01-06 LAB — CORTISOL: Cortisol, Plasma: 24.6 ug/dL

## 2014-01-06 LAB — URINE CULTURE
Colony Count: NO GROWTH
Culture: NO GROWTH
Special Requests: NORMAL

## 2014-01-06 LAB — PREPARE RBC (CROSSMATCH)

## 2014-01-06 LAB — BASIC METABOLIC PANEL
BUN: 10 mg/dL (ref 6–23)
CO2: 17 mEq/L — ABNORMAL LOW (ref 19–32)
CREATININE: 1.13 mg/dL — AB (ref 0.50–1.10)
Calcium: 6.9 mg/dL — ABNORMAL LOW (ref 8.4–10.5)
Chloride: 113 mEq/L — ABNORMAL HIGH (ref 96–112)
GFR calc Af Amer: 78 mL/min — ABNORMAL LOW (ref >90)
GFR, EST NON AFRICAN AMERICAN: 67 mL/min — AB (ref >90)
Glucose, Bld: 109 mg/dL — ABNORMAL HIGH (ref 70–99)
Potassium: 4.2 mEq/L (ref 3.7–5.3)
SODIUM: 141 meq/L (ref 137–147)

## 2014-01-06 LAB — APTT: APTT: 30 s (ref 24–37)

## 2014-01-06 LAB — TSH: TSH: 2.054 u[IU]/mL (ref 0.350–4.500)

## 2014-01-06 MED ORDER — OXYCODONE HCL 5 MG PO TABS
5.0000 mg | ORAL_TABLET | ORAL | Status: DC | PRN
  Administered 2014-01-06 – 2014-01-09 (×7): 5 mg via ORAL
  Filled 2014-01-06 (×7): qty 1

## 2014-01-06 MED ORDER — SODIUM CHLORIDE 0.9 % IV BOLUS (SEPSIS)
1000.0000 mL | Freq: Once | INTRAVENOUS | Status: AC
  Administered 2014-01-06: 1000 mL via INTRAVENOUS

## 2014-01-06 NOTE — Progress Notes (Signed)
Nutrition Consult - Brief Note  Received consult for TF recommendations. Patient is now being extubated with no plans to start TF. No nutrition problems identified on admission. RD to monitor diet advancement and follow-up as needed. Please re-consult RD if further nutrition concerns arise.  Joaquin CourtsKimberly Vitali Seibert, RD, LDN, CNSC Pager [phone] After Hours Pager [phone]

## 2014-01-06 NOTE — Progress Notes (Signed)
Brief Interval Progress Note  S: I was called to the room around 2300 for complaints of pain.  The patient reports a bilateral lower quadrant abdominal pain, described as a "dull" pain, without radiation.  She first noticed the pain earlier today when sitting in a chair.  The pain has been relatively constant.  She notes no dysuria (though some irritation from foley), and no bowel movement in 3-4 days.  O: Filed Vitals:   01/06/14 1930 01/06/14 1945 01/06/14 2000 01/06/14 2015  BP: 128/74  128/64   Pulse: 119  128   Temp:  100.2 F (37.9 C)  98.8 F (37.1 C)  TempSrc:  Oral  Oral  Resp: 0  20   Height:      Weight:      SpO2: 100%  99%    Gen: Lying in bed, appears mildly uncomfortable CV: RRR Pulm: CTAB Abd: soft, mildly tender to RLQ and LLQ abdominal palpation, bandage removed and C-section scar stapled, clean, dry, and intact, without surrounding erythema.  Non-distended, hypoactive bowel sounds.  No rebound tenderness or guarding. Neuro: A&Ox3, CN II-XII grossly intact  A/P: The patient notes mild-moderate LLQ and RLQ abd pain.  Abd exam reveals mild pain, but no guarding or rebound tenderness.  C-section scar appears clean and dry, without any evidence of infection.  I believe pain is likely 2/2 uterine IR ablation procedure, and it appears it was previously well-controlled on fentanyl, which has been since discontinued. -start oxy IR 5 mg PO q3prn for pain  Signed, Janalyn Harderyan Jaquon Gingerich, PGY3 Pgr. [phone] 01/06/2014, 11:19 PM

## 2014-01-06 NOTE — Progress Notes (Signed)
eLink Physician-Brief Progress Note Patient Name: Erika MayhewShanekia Ventura DOB: [birth year] MRN: [id number]  Date of Service  01/06/2014   HPI/Events of Note   Pt with Hgb 6.6   Not actively bleeding  eICU Interventions  Transfuse one unit PRBC   Intervention Category Intermediate Interventions: Bleeding - evaluation and treatment with blood products  Shan Levansatrick Janelie Goltz 01/06/2014, 6:33 PM

## 2014-01-06 NOTE — Progress Notes (Signed)
1800 Small amount of vaginal bleeding noted. 1 pad with moderate bleeding noted in from (1:33pm to 1800). HCT 18.7, Hgb 6.6 and HR of 130 called to Dr. Delford FieldWright.

## 2014-01-06 NOTE — Progress Notes (Signed)
POD # 20 Wright/p Csection, POD #1 Wright/p exlap, repair of posterior uterine perforation and IR [place] Subjective: Patient remains intubated.  Vaginal bleeding has improved over last few hours.  She received 1 more unit over night.  BP is much more stable.  Remains tachycardic.  Objective: I have reviewed patient'Wright vital signs, intake and output, medications and labs.  General: intubated and sedated GI: normal findings: no organomegaly and uterus is low in the pelvis, abnormal findings:  moderate tenderness in the lower abdomen and incision: dry and no drainage present Extremities: edema 2+ Vaginal Bleeding: minimal  Assessment: Hemorrhagic shock- improved Much more stable Much less bleeding  Plan: Continue supportive care. She is hopefully out of the woods in terms of needs for further surgery at this point.  LOS: 1 day    Erika Wright 01/06/2014, 8:16 AM

## 2014-01-06 NOTE — Progress Notes (Signed)
Assessed patient for bleeding.  One maternity pad was saturated and a clot the size of a tangerine was noted.  Clean pads were placed.  Will continue to assess.

## 2014-01-06 NOTE — Progress Notes (Signed)
Overnight Brief Progress:    S:  Patient returned from IR embolization of uterine artery.  RN reports 1 large soft ball sized clot with few golf ball sized post procedure.    Ceasar Mons:   Filed Vitals:   01/05/14 2321 01/05/14 2327 01/05/14 2354 01/06/14 0010  BP: 115/69 122/66 107/66   Pulse: 116 108 115   Temp:    100.1 F (37.8 C)  TempSrc:    Oral  Resp: 23 12 23    Height:      Weight:      SpO2: 100% 100% 100%    Exam: General: wdwn female in NAD Neuro: sedate, comfortable on vent CV: s1s2 rrr PULM: resp's even/non-labored, synchronous with vent  GI: abd round / soft, bsx4 active Extremities: warm/dry.  R groin cordis in place, c/d/i   A:  Hemorrhagic shock (resolved) in setting of uterine bleeding s/p IR embolization Tachycardia   Acute Respiratory Failure  P:   -repeat CBC now, then Q6  -tx if hgb <7% -f/u labs in am -cxr in am  -off pressors, monitor MAP -1L NS now for tachycardia -SBT in am for potential extubation   Canary BrimBrandi Ollis, NP-C [place] [facility] Pgr: [phone] or [phone]  Patient returned from OR at 11:30 PM.  Came and evaluated patient.  BP much more stable.  Will likely be able to extubate in AM.  Pressors d/ced, remains tachycardic, will give a liter of IVF and continue transfusion as needed.  H&H ordered.  SBT in AM.  Additional CC time of 45 min for services rendered on 1/13.  Patient seen and examined, agree with above note.  I dictated the care and orders written for this patient under my direction.  Alyson ReedyWesam G Trevar Boehringer, MD [phone]

## 2014-01-06 NOTE — Procedures (Signed)
Extubation Procedure Note  Patient Details:   Name: Maudry MayhewShanekia Kolker DOB: [birth year] MRN: [id number]   Airway Documentation:     Evaluation  O2 sats: stable throughout Complications: No apparent complications Patient did tolerate procedure well. Bilateral Breath Sounds: Rhonchi;Diminished Suctioning: Airway Yes  Ave Filterdkins, Avien Taha Williams 01/06/2014, 1:48 PM

## 2014-01-06 NOTE — Progress Notes (Signed)
PULMONARY / CRITICAL CARE MEDICINE  Name: Erika Wright MRN: [id number] DOB: 1991/03/04    ADMISSION DATE:  01/05/2014 CONSULTATION DATE:  01/05/2014  REFERRING MD :  Dr. Manfred ShirtsShaw [facility]  PRIMARY SERVICE: Pulmonary/Critical Care  CHIEF COMPLAINT:  Bleeding/Unresponsiveness  BRIEF PATIENT DESCRIPTION: Obese 22y.o. African [place] female G3P3 only history of "leaky heart valve" delivered by c-section 12/17/13 crit 24.4 returned 1/12 with hemorrhage s/p 2 D and [facility], [facility] with DIC and shock.  SIGNIFICANT EVENTS / STUDIES:  1/12  To ED, uncontrolled vaginal bleeding, D&C x 2, repair uterine tearintubated, cortis placed 1/13  Continued bleeding, tx to [facility] 8 units PRBC, FFP 6 Plat 2 1 cryo 1/13  IR embolization of uterine vessels without ability to find vessel in hemorrhagic region  LINES / TUBES: R femoral cortis 1/12 >>> Foley 1/12 >>> ETT 1/12 >>> NGT 1/12 >>>  CULTURES: 1/13  Urine >>>  ANTIBIOTICS: Clindamycin 1/12 >>> 1/13 Ampicillin 1/12 >>> 1/13 Zosyn 1/13 >>>   SUBJECTIVE: Intubated, less tachy, still with vaginal bleeding with clots overnight. Off pressors.   VITAL SIGNS: Temp:  [98.5 F (36.9 C)-100.4 F (38 C)] 99.1 F (37.3 C) (01/14 0423) Pulse Rate:  [102-212] 111 (01/14 0515) Resp:  [0-30] 15 (01/14 0515) BP: (77-122)/(42-82) 119/72 mmHg (01/14 0515) SpO2:  [100 %] 100 % (01/14 0515) FiO2 (%):  [40 %] 40 % (01/14 0423) Weight:  [216 lb 11.4 oz (98.3 kg)] 216 lb 11.4 oz (98.3 kg) (01/14 0455)  HEMODYNAMICS:   VENTILATOR SETTINGS: Vent Mode:  [-] PRVC FiO2 (%):  [40 %] 40 % Set Rate:  [14 bmp] 14 bmp Vt Set:  [500 mL] 500 mL PEEP:  [5 cmH20] 5 cmH20 Plateau Pressure:  [19 cmH20-22 cmH20] 20 cmH20  INTAKE / OUTPUT: Intake/Output     01/13 0701 - 01/14 0700 01/14 0701 - 01/15 0700   I.V. (mL/kg) 695.3 (7.1)    Blood 1072.5    Total Intake(mL/kg) 1767.8 (18)    Urine (mL/kg/hr) 495    Total Output 495     Net +1272.8             PHYSICAL EXAMINATION: General:  Sedated, obese, minimally responsive Neuro:  Withdraws to pain, PERRLA HEENT:  ETT in place Cardiovascular:  Regular rate, tachy 100's, no murmur appreciated Lungs:  Mostly clear bilaterally, good air movement, no increased WOB Abdomen:  Distended, hypoactive bowel sounds, non to minimally tender Musculoskeletal:  Moves all extremities Skin:  Intact, C/S site with intact dry/clean dressing  LABS:  CBC  Recent Labs Lab 01/05/14 2007 01/05/14 2241 01/06/14 0530  WBC 12.9* 13.0* 13.7*  HGB 7.4* 6.5* 7.4*  HCT 20.8* 18.1* 20.8*  PLT 159 148* 139*   Coag's  Recent Labs Lab 01/05/14 1333  APTT 29  INR 1.42   BMET  Recent Labs Lab 01/05/14 1333 01/06/14 0530  NA 137 141  K 4.4 4.2  CL 109 113*  CO2 18* 17*  BUN 9 10  CREATININE 1.06 1.13*  GLUCOSE 132* 109*   Electrolytes  Recent Labs Lab 01/05/14 1333 01/06/14 0530  CALCIUM 6.2* 6.9*  MG 3.2*  --   PHOS 3.9  --    Sepsis Markers  Recent Labs Lab 01/05/14 1334  LATICACIDVEN 1.6   ABG  Recent Labs Lab 01/05/14 1343 01/06/14 0100 01/06/14 0401  PHART 7.349* 7.362 7.369  PCO2ART 31.3* 32.6* 29.7*  PO2ART 160.0* 137.0* 153.0*   Liver Enzymes  Recent Labs Lab 01/05/14 1333  AST 21  ALT 13  ALKPHOS 43  BILITOT 0.6  ALBUMIN 1.7*   Cardiac Enzymes No results found for this basename: TROPONINI, PROBNP,  in the last 168 hours Glucose  Recent Labs Lab 01/05/14 1258 01/05/14 1533 01/05/14 1904 01/06/14 0007 01/06/14 0358  GLUCAP 116* 112* 125* 119* 124*   CXR:  ett wnl, small lung volumes, mild ATX  ASSESSMENT / PLAN:  PULMONARY A:  Acute respiratory failure in setting of hemorrhagic shock. P:   Extubate SpO2>92 Supplemental oxygen PRN  CARDIOVASCULAR A:  Hemorrhagic shock. Tachycardia. "Leaky heart valve" P:  Goal MAP > 60 D/c Levophed TTE Reevaluate the need for fem line on 1/15  RENAL A:   AKI. Mild metabolic  acidosis. Clinically hypovolemic. P:   Trend BMP NS 1000 x 1  GASTROINTESTINAL A:   GI Px is not indicated. Nutrition P:   D/c Protonix Diet as tolerated  HEMATOLOGIC A:   Hemmorhagic shock resolving. Uterine hemorrhage. DVT Px. P:  OB/GYN following Goal Hg 7 CBC q12h SCDs  INFECTIOUS A:   Suspected endometritis. P:   Abx as above Follow path to ensure no retained products  ENDOCRINE A:   No active issues. P:   D/c SSI  NEUROLOGIC A:   Acute encephalopathy, resolved. P:   D/c Propofol gtt D/c Fentanyl gtt Fentanyl prn for pain  01/06/2014, 7:04 AM Genella Mech  I have personally obtained history, examined patient, evaluated and interpreted laboratory and imaging results, reviewed medical records, formulated assessment / plan and placed orders.  CRITICAL CARE:  The patient is critically ill with multiple organ systems failure and requires high complexity decision making for assessment and support, frequent evaluation and titration of therapies, application of advanced monitoring technologies and extensive interpretation of multiple databases. Critical Care Time devoted to patient care services described in this note is 35 minutes.   Lonia Farber, MD Pulmonary and Critical Care Medicine [facility] Pager: [phone]  01/06/2014, 1:32 PM

## 2014-01-06 NOTE — Progress Notes (Signed)
[facility].  Approx 25cc wasted in sink  with Constance HawAlicia Sperry, RN.

## 2014-01-06 NOTE — Progress Notes (Signed)
Subjective: Delayed uterine hemorrhage Post hysterectomy 12/17/13 Doing well til 01/04/14--developed uncontrolled vaginal bleeding To [facility]: D&C in OR Rt uterine artery coiling performed in IR 1/13 pm' Pt feels better; bleeding less  Objective: Vital signs in last 24 hours: Temp:  [98.5 F (36.9 C)-100.4 F (38 C)] 99.2 F (37.3 C) (01/14 0836) Pulse Rate:  [102-212] 123 (01/14 0755) Resp:  [0-30] 21 (01/14 0755) BP: (77-122)/(42-82) 113/64 mmHg (01/14 0755) SpO2:  [100 %] 100 % (01/14 0755) FiO2 (%):  [40 %] 40 % (01/14 0755) Weight:  [216 lb 11.4 oz (98.3 kg)] 216 lb 11.4 oz (98.3 kg) (01/14 0455)    Intake/Output from previous day: 01/13 0701 - 01/14 0700 In: 1794.6 [I.V.:722.1; Blood:1072.5] Out: 620 [Urine:620] Intake/Output this shift:    PE: on vent; nodding head for yes/no to questions appropriately VSS Low grade temp: 99.2 Rt groin site with line from [place] L groin site sl tender per pt No bleeding; no hematoma L foot 2+pulses Hg stable at 7.4  Lab Results:   Recent Labs  01/06/14 0530 01/06/14 1014  WBC 13.7* 15.0*  HGB 7.4* 7.4*  HCT 20.8* 20.9*  PLT 139* 153   BMET  Recent Labs  01/05/14 1333 01/06/14 0530  NA 137 141  K 4.4 4.2  CL 109 113*  CO2 18* 17*  GLUCOSE 132* 109*  BUN 9 10  CREATININE 1.06 1.13*  CALCIUM 6.2* 6.9*   PT/INR  Recent Labs  01/05/14 1333  LABPROT 17.0*  INR 1.42   ABG  Recent Labs  01/06/14 0100 01/06/14 0401  PHART 7.362 7.369  HCO3 18.0* 16.7*    Studies/Results: Ir Angiogram Pelvis Selective Or Supraselective  01/06/2014   INDICATION: History of hysterectomy (12/17/2013), now with delayed post partum hemorrhage.  EXAM: 1. PELVIC ARTERIOGRAM 2. BILATERAL INTERNAL ILIAC ARTERIOGRAMS 3. SELECTIVE RIGHT UTERINE ARTERY ARTERIOGRAM AND PERCUTANEOUS COIL AND PARTICLE EMBOLIZATION 4. ULTRASOUND GUIDANCE FOR ARTERIAL ACCESS  MEDICATIONS: The patient is currently intubated and on Propofol  and Fentanyl drips. Patient was administered an additional 4 mg of Versed a during the procedure.  ANESTHESIA/SEDATION: Sedation time  100 minutes  CONTRAST:  113m OMNIPAQUE IOHEXOL 300 MG/ML  SOLN  COMPARISON:  [place] dated 01/05/2014  FLUOROSCOPY TIME:  29 minutes.  24 seconds.  PROCEDURE: Informed consent was obtained from the patient's family following explanation of the procedure, risks, benefits and alternatives. The patient's family understands, agrees and consents for the procedure. All questions were addressed. A time out was performed prior to the initiation of the procedure. Maximal barrier sterile technique utilized including caps, mask, sterile gowns, sterile gloves, large sterile drape, hand hygiene, and Betadine prep.  Given the presence of an existing right femoral central venous catheter, the decision was made to access the contralateral left common femoral artery. The left femoral head was marked fluoroscopically. Under sterile conditions and local anesthesia, the left common femoral artery access was performed with a micropuncture needle. Under direct ultrasound guidance, the right common femoral was accessed with a micropuncture kit. An ultrasound image was saved for documentation purposes. This allowed for placement of a 5-French vascular sheath. A limited arteriogram was performed through the side arm of the sheath confirming appropriate access within the left common femoral artery.  An Omniflush catheter was advanced just above the aortic bifurcation a pelvic arteriogram was performed. A stiff glide wire was advanced into the contralateral right common femoral artery and under intermittent fluoroscopic guidance, the Omni flush catheter was exchanged  for a C2 catheter.  The C2 catheter was utilized to select the contralateral right internal iliac artery. Selective right internal iliac angiogram was performed. 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 Selective catheterization  was performed of the right uterine artery with a microcatheter and micro guide wire. 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  Given the spasmodic nature of the right uterine artery, the decision was initially made to perform particle embolization from this location. Particle embolization was initially performed with approximately 1/2 vial of 500 - 700 micron Embospheres, however secondary to suboptimal visualization of the embolic material, the right uterine artery was further particle embolized with Gelfoam slurry.  Despite administering what was deemed an adequate volume of particle embolization material, there was persistent flow to this pseudoaneurysm with ultimate extravasation into the pelvis. As such, the micro catheter was advanced to the neck of the pseudoaneurysm / area of extravasation and the distal aspect of the right uterine artery was coil embolized with multiple overlapping 4 and 3 mm diameter pushable coils. The micro catheter was withdrawn into the more proximal aspect of the right uterine artery and additional Gel-Foam slurry was administered for particle embolization. The micro catheter was removed and a repeat right internal iliac arteriogram was performed demonstrating complete occlusion of the right internal iliac artery without persistent supply to the area of extravasation.  The C2 catheter was then utilized to select of the ipsilateral left internal iliac artery and a selective left internal iliac arteriogram was performed in various obliquities.  At this point, the procedure was terminated. At this point, all wires, catheters and sheaths were removed from the patient. Hemostasis was achieved at the left groin access site with deployment of an Exoseal closure device. The patient tolerated the procedure well without immediate post procedural complication.  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  A right internal iliac and sub selective right uterine arteriograms were performed demonstrating supply to this pseudoaneurysm via a spasmodic right uterine artery.  Despite sub selective particle embolization of the right uterine artery with Embospheres and Gel-Foam slurry, flow to the pseudoaneurysm persisted with ultimate frank extravasation into the pelvis. As such, the neck of the pseudoaneurysm / area of extravasation as well as the distal aspect of the right uterine artery were coil embolized with multiple overlapping pushable coils. The distal aspect of the right uterine artery was then further particle embolized with a small amount of additional Gel-Foam slurry.  Post right internal iliac arteriogram demonstrates complete occlusion of the right uterine artery without persistent flow to the pseudoaneurysm / area of extravasation.  Sub selective left internal iliac arteriogram was performed in various obliquities however failed to delineate a definitive left-sided uterine artery. Additionally, there was no definitive supply to the pseudoaneurysm / area of extravasation within the midline of the pelvis. As such, a left-sided embolization was not performed.  IMPRESSION: 1. 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 2. Sub selective left internal iliac arteriogram failed to delineate a patent left-sided uterine artery or definitive supply to the pseudoaneurysm / area of extravasation and as such, a left-sided embolization was not performed. Critical Value/emergent results were discussed with Dr. Lavella Lemons PRATT at the completion of the procedure who verbally acknowledged these results.   Electronically Signed  By: Sandi Mariscal M.D.   On: 01/06/2014 08:35   Ir Angiogram Pelvis Selective Or  Supraselective  01/06/2014   INDICATION: History of hysterectomy (12/17/2013), now with delayed post partum hemorrhage.  EXAM: 1. PELVIC ARTERIOGRAM 2. BILATERAL INTERNAL ILIAC ARTERIOGRAMS 3. SELECTIVE RIGHT UTERINE ARTERY ARTERIOGRAM AND PERCUTANEOUS COIL AND PARTICLE EMBOLIZATION 4. ULTRASOUND GUIDANCE FOR ARTERIAL ACCESS  MEDICATIONS: The patient is currently intubated and on Propofol and Fentanyl drips. Patient was administered an additional 4 mg of Versed a during the procedure.  ANESTHESIA/SEDATION: Sedation time  100 minutes  CONTRAST:  172m OMNIPAQUE IOHEXOL 300 MG/ML  SOLN  COMPARISON:  [place] dated 01/05/2014  FLUOROSCOPY TIME:  29 minutes.  24 seconds.  PROCEDURE: Informed consent was obtained from the patient's family following explanation of the procedure, risks, benefits and alternatives. The patient's family understands, agrees and consents for the procedure. All questions were addressed. A time out was performed prior to the initiation of the procedure. Maximal barrier sterile technique utilized including caps, mask, sterile gowns, sterile gloves, large sterile drape, hand hygiene, and Betadine prep.  Given the presence of an existing right femoral central venous catheter, the decision was made to access the contralateral left common femoral artery. The left femoral head was marked fluoroscopically. Under sterile conditions and local anesthesia, the left common femoral artery access was performed with a micropuncture needle. Under direct ultrasound guidance, the right common femoral was accessed with a micropuncture kit. An ultrasound image was saved for documentation purposes. This allowed for placement of a 5-French vascular sheath. A limited arteriogram was performed through the side arm of the sheath confirming appropriate access within the left common femoral artery.  An Omniflush catheter was advanced just above the aortic bifurcation a pelvic arteriogram was performed. A stiff  glide wire was advanced into the contralateral right common femoral artery and under intermittent fluoroscopic guidance, the Omni flush catheter was exchanged for a C2 catheter.  The C2 catheter was utilized to select the contralateral right internal iliac artery. Selective right internal iliac angiogram was performed. 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 Selective catheterization was performed of the right uterine artery with a microcatheter and micro guide wire. 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  Given the spasmodic nature of the right uterine artery, the decision was initially made to perform particle embolization from this location. Particle embolization was initially performed with approximately 1/2 vial of 500 - 700 micron Embospheres, however secondary to suboptimal visualization of the embolic material, the right uterine artery was further particle embolized with Gelfoam slurry.  Despite administering what was deemed an adequate volume of particle embolization material, there was persistent flow to this pseudoaneurysm with ultimate extravasation into the pelvis. As such, the micro catheter was advanced to the neck of the pseudoaneurysm / area of extravasation and the distal aspect of the right uterine artery was coil embolized with multiple overlapping 4 and 3 mm diameter pushable coils. The micro catheter was withdrawn into the more proximal aspect of the right uterine artery and additional Gel-Foam slurry was administered for particle embolization. The micro catheter was removed and a repeat right internal iliac arteriogram was performed demonstrating complete occlusion of the right internal iliac artery without persistent supply to the area of extravasation.  The C2 catheter was then utilized to select of the ipsilateral left internal iliac artery and a selective left internal iliac  arteriogram was performed  in various obliquities.  At this point, the procedure was terminated. At this point, all wires, catheters and sheaths were removed from the patient. Hemostasis was achieved at the left groin access site with deployment of an Exoseal closure device. The patient tolerated the procedure well without immediate post procedural complication.  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  A right internal iliac and sub selective right uterine arteriograms were performed demonstrating supply to this pseudoaneurysm via a spasmodic right uterine artery.  Despite sub selective particle embolization of the right uterine artery with Embospheres and Gel-Foam slurry, flow to the pseudoaneurysm persisted with ultimate frank extravasation into the pelvis. As such, the neck of the pseudoaneurysm / area of extravasation as well as the distal aspect of the right uterine artery were coil embolized with multiple overlapping pushable coils. The distal aspect of the right uterine artery was then further particle embolized with a small amount of additional Gel-Foam slurry.  Post right internal iliac arteriogram demonstrates complete occlusion of the right uterine artery without persistent flow to the pseudoaneurysm / area of extravasation.  Sub selective left internal iliac arteriogram was performed in various obliquities however failed to delineate a definitive left-sided uterine artery. Additionally, there was no definitive supply to the pseudoaneurysm / area of extravasation within the midline of the pelvis. As such, a left-sided embolization was not performed.  IMPRESSION: 1. 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 2. Sub selective left internal iliac arteriogram failed to delineate a patent  left-sided uterine artery or definitive supply to the pseudoaneurysm / area of extravasation and as such, a left-sided embolization was not performed. Critical Value/emergent results were discussed with Dr. Lavella Lemons PRATT at the completion of the procedure who verbally acknowledged these results.   Electronically Signed   By: Sandi Mariscal M.D.   On: 01/06/2014 08:35   Ir Angiogram Selective Each Additional Vessel  01/06/2014   INDICATION: History of hysterectomy (12/17/2013), now with delayed post partum hemorrhage.  EXAM: 1. PELVIC ARTERIOGRAM 2. BILATERAL INTERNAL ILIAC ARTERIOGRAMS 3. SELECTIVE RIGHT UTERINE ARTERY ARTERIOGRAM AND PERCUTANEOUS COIL AND PARTICLE EMBOLIZATION 4. ULTRASOUND GUIDANCE FOR ARTERIAL ACCESS  MEDICATIONS: The patient is currently intubated and on Propofol and Fentanyl drips. Patient was administered an additional 4 mg of Versed a during the procedure.  ANESTHESIA/SEDATION: Sedation time  100 minutes  CONTRAST:  121m OMNIPAQUE IOHEXOL 300 MG/ML  SOLN  COMPARISON:  [place] dated 01/05/2014  FLUOROSCOPY TIME:  29 minutes.  24 seconds.  PROCEDURE: Informed consent was obtained from the patient's family following explanation of the procedure, risks, benefits and alternatives. The patient's family understands, agrees and consents for the procedure. All questions were addressed. A time out was performed prior to the initiation of the procedure. Maximal barrier sterile technique utilized including caps, mask, sterile gowns, sterile gloves, large sterile drape, hand hygiene, and Betadine prep.  Given the presence of an existing right femoral central venous catheter, the decision was made to access the contralateral left common femoral artery. The left femoral head was marked fluoroscopically. Under sterile conditions and local anesthesia, the left common femoral artery access was performed with a micropuncture needle. Under direct ultrasound guidance, the right common femoral was  accessed with a micropuncture kit. An ultrasound image was saved for documentation purposes. This allowed for placement of a 5-French vascular sheath. A limited arteriogram was  performed through the side arm of the sheath confirming appropriate access within the left common femoral artery.  An Omniflush catheter was advanced just above the aortic bifurcation a pelvic arteriogram was performed. A stiff glide wire was advanced into the contralateral right common femoral artery and under intermittent fluoroscopic guidance, the Omni flush catheter was exchanged for a C2 catheter.  The C2 catheter was utilized to select the contralateral right internal iliac artery. Selective right internal iliac angiogram was performed. 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 Selective catheterization was performed of the right uterine artery with a microcatheter and micro guide wire. 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  Given the spasmodic nature of the right uterine artery, the decision was initially made to perform particle embolization from this location. Particle embolization was initially performed with approximately 1/2 vial of 500 - 700 micron Embospheres, however secondary to suboptimal visualization of the embolic material, the right uterine artery was further particle embolized with Gelfoam slurry.  Despite administering what was deemed an adequate volume of particle embolization material, there was persistent flow to this pseudoaneurysm with ultimate extravasation into the pelvis. As such, the micro catheter was advanced to the neck of the pseudoaneurysm / area of extravasation and the distal aspect of the right uterine artery was coil embolized with multiple overlapping 4 and 3 mm diameter pushable coils. The micro catheter was withdrawn into the more proximal aspect of the right uterine artery and additional  Gel-Foam slurry was administered for particle embolization. The micro catheter was removed and a repeat right internal iliac arteriogram was performed demonstrating complete occlusion of the right internal iliac artery without persistent supply to the area of extravasation.  The C2 catheter was then utilized to select of the ipsilateral left internal iliac artery and a selective left internal iliac arteriogram was performed in various obliquities.  At this point, the procedure was terminated. At this point, all wires, catheters and sheaths were removed from the patient. Hemostasis was achieved at the left groin access site with deployment of an Exoseal closure device. The patient tolerated the procedure well without immediate post procedural complication.  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  A right internal iliac and sub selective right uterine arteriograms were performed demonstrating supply to this pseudoaneurysm via a spasmodic right uterine artery.  Despite sub selective particle embolization of the right uterine artery with Embospheres and Gel-Foam slurry, flow to the pseudoaneurysm persisted with ultimate frank extravasation into the pelvis. As such, the neck of the pseudoaneurysm / area of extravasation as well as the distal aspect of the right uterine artery were coil embolized with multiple overlapping pushable coils. The distal aspect of the right uterine artery was then further particle embolized with a small amount of additional Gel-Foam slurry.  Post right internal iliac arteriogram demonstrates complete occlusion of the right uterine artery without persistent flow to the pseudoaneurysm / area of extravasation.  Sub selective left internal iliac arteriogram was performed in various obliquities however failed to delineate a definitive left-sided uterine artery. Additionally, there was no  definitive supply to the pseudoaneurysm / area of extravasation within the midline of the pelvis. As such, a left-sided embolization was not performed.  IMPRESSION: 1. 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 2. Sub selective  left internal iliac arteriogram failed to delineate a patent left-sided uterine artery or definitive supply to the pseudoaneurysm / area of extravasation and as such, a left-sided embolization was not performed. Critical Value/emergent results were discussed with Dr. Lavella Lemons PRATT at the completion of the procedure who verbally acknowledged these results.   Electronically Signed   By: Sandi Mariscal M.D.   On: 01/06/2014 08:35   Ir Angiogram Selective Each Additional Vessel  01/06/2014   INDICATION: History of hysterectomy (12/17/2013), now with delayed post partum hemorrhage.  EXAM: 1. PELVIC ARTERIOGRAM 2. BILATERAL INTERNAL ILIAC ARTERIOGRAMS 3. SELECTIVE RIGHT UTERINE ARTERY ARTERIOGRAM AND PERCUTANEOUS COIL AND PARTICLE EMBOLIZATION 4. ULTRASOUND GUIDANCE FOR ARTERIAL ACCESS  MEDICATIONS: The patient is currently intubated and on Propofol and Fentanyl drips. Patient was administered an additional 4 mg of Versed a during the procedure.  ANESTHESIA/SEDATION: Sedation time  100 minutes  CONTRAST:  18m OMNIPAQUE IOHEXOL 300 MG/ML  SOLN  COMPARISON:  [place] COMPLE[place] dated 01/05/2014  FLUOROSCOPY TIME:  29 minutes.  24 seconds.  PROCEDURE: Informed consent was obtained from the patient's family following explanation of the procedure, risks, benefits and alternatives. The patient's family understands, agrees and consents for the procedure. All questions were addressed. A time out was performed prior to the initiation of the procedure. Maximal barrier sterile technique utilized including caps, mask, sterile gowns, sterile gloves, large sterile drape, hand hygiene, and Betadine prep.   Given the presence of an existing right femoral central venous catheter, the decision was made to access the contralateral left common femoral artery. The left femoral head was marked fluoroscopically. Under sterile conditions and local anesthesia, the left common femoral artery access was performed with a micropuncture needle. Under direct ultrasound guidance, the right common femoral was accessed with a micropuncture kit. An ultrasound image was saved for documentation purposes. This allowed for placement of a 5-French vascular sheath. A limited arteriogram was performed through the side arm of the sheath confirming appropriate access within the left common femoral artery.  An Omniflush catheter was advanced just above the aortic bifurcation a pelvic arteriogram was performed. A stiff glide wire was advanced into the contralateral right common femoral artery and under intermittent fluoroscopic guidance, the Omni flush catheter was exchanged for a C2 catheter.  The C2 catheter was utilized to select the contralateral right internal iliac artery. Selective right internal iliac angiogram was performed. 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 Selective catheterization was performed of the right uterine artery with a microcatheter and micro guide wire. 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  Given the spasmodic nature of the right uterine artery, the decision was initially made to perform particle embolization from this location. Particle embolization was initially performed with approximately 1/2 vial of 500 - 700 micron Embospheres, however secondary to suboptimal visualization of the embolic material, the right uterine artery was further particle embolized with Gelfoam slurry.  Despite administering what was deemed an adequate volume of particle embolization material, there was persistent flow to this  pseudoaneurysm with ultimate extravasation into the pelvis. As such, the micro catheter was advanced to the neck of the pseudoaneurysm / area of extravasation and the distal aspect of the right uterine artery was coil embolized with multiple overlapping 4 and 3 mm diameter pushable coils. The micro catheter was withdrawn into the more proximal aspect of the right uterine artery and additional Gel-Foam slurry was administered for particle embolization.  The micro catheter was removed and a repeat right internal iliac arteriogram was performed demonstrating complete occlusion of the right internal iliac artery without persistent supply to the area of extravasation.  The C2 catheter was then utilized to select of the ipsilateral left internal iliac artery and a selective left internal iliac arteriogram was performed in various obliquities.  At this point, the procedure was terminated. At this point, all wires, catheters and sheaths were removed from the patient. Hemostasis was achieved at the left groin access site with deployment of an Exoseal closure device. The patient tolerated the procedure well without immediate post procedural complication.  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  A right internal iliac and sub selective right uterine arteriograms were performed demonstrating supply to this pseudoaneurysm via a spasmodic right uterine artery.  Despite sub selective particle embolization of the right uterine artery with Embospheres and Gel-Foam slurry, flow to the pseudoaneurysm persisted with ultimate frank extravasation into the pelvis. As such, the neck of the pseudoaneurysm / area of extravasation as well as the distal aspect of the right uterine artery were coil embolized with multiple overlapping pushable coils. The distal aspect of the right uterine artery was then further particle embolized with  a small amount of additional Gel-Foam slurry.  Post right internal iliac arteriogram demonstrates complete occlusion of the right uterine artery without persistent flow to the pseudoaneurysm / area of extravasation.  Sub selective left internal iliac arteriogram was performed in various obliquities however failed to delineate a definitive left-sided uterine artery. Additionally, there was no definitive supply to the pseudoaneurysm / area of extravasation within the midline of the pelvis. As such, a left-sided embolization was not performed.  IMPRESSION: 1. 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 2. Sub selective left internal iliac arteriogram failed to delineate a patent left-sided uterine artery or definitive supply to the pseudoaneurysm / area of extravasation and as such, a left-sided embolization was not performed. Critical Value/emergent results were discussed with Dr. Lavella Lemons PRATT at the completion of the procedure who verbally acknowledged these results.   Electronically Signed   By: Sandi Mariscal M.D.   On: 01/06/2014 08:35   Ir Angiogram Follow Up Study  01/06/2014   INDICATION: History of hysterectomy (12/17/2013), now with delayed post partum hemorrhage.  EXAM: 1. PELVIC ARTERIOGRAM 2. BILATERAL INTERNAL ILIAC ARTERIOGRAMS 3. SELECTIVE RIGHT UTERINE ARTERY ARTERIOGRAM AND PERCUTANEOUS COIL AND PARTICLE EMBOLIZATION 4. ULTRASOUND GUIDANCE FOR ARTERIAL ACCESS  MEDICATIONS: The patient is currently intubated and on Propofol and Fentanyl drips. Patient was administered an additional 4 mg of Versed a during the procedure.  ANESTHESIA/SEDATION: Sedation time  100 minutes  CONTRAST:  149m OMNIPAQUE IOHEXOL 300 MG/ML  SOLN  COMPARISON:  [place] dated 01/05/2014  FLUOROSCOPY TIME:  29 minutes.  24 seconds.  PROCEDURE: Informed consent was obtained from the patient's family following  explanation of the procedure, risks, benefits and alternatives. The patient's family understands, agrees and consents for the procedure. All questions were addressed. A time out was performed prior to the initiation of the procedure. Maximal barrier sterile technique utilized including caps, mask, sterile gowns, sterile gloves, large sterile drape, hand hygiene, and Betadine prep.  Given the presence of an existing right femoral central venous catheter, the decision was made to access the contralateral left common femoral artery. The left femoral head was  marked fluoroscopically. Under sterile conditions and local anesthesia, the left common femoral artery access was performed with a micropuncture needle. Under direct ultrasound guidance, the right common femoral was accessed with a micropuncture kit. An ultrasound image was saved for documentation purposes. This allowed for placement of a 5-French vascular sheath. A limited arteriogram was performed through the side arm of the sheath confirming appropriate access within the left common femoral artery.  An Omniflush catheter was advanced just above the aortic bifurcation a pelvic arteriogram was performed. A stiff glide wire was advanced into the contralateral right common femoral artery and under intermittent fluoroscopic guidance, the Omni flush catheter was exchanged for a C2 catheter.  The C2 catheter was utilized to select the contralateral right internal iliac artery. Selective right internal iliac angiogram was performed. 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 Selective catheterization was performed of the right uterine artery with a microcatheter and micro guide wire. 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  Given the spasmodic nature of the right uterine artery, the decision was initially made to perform particle embolization from this location.  Particle embolization was initially performed with approximately 1/2 vial of 500 - 700 micron Embospheres, however secondary to suboptimal visualization of the embolic material, the right uterine artery was further particle embolized with Gelfoam slurry.  Despite administering what was deemed an adequate volume of particle embolization material, there was persistent flow to this pseudoaneurysm with ultimate extravasation into the pelvis. As such, the micro catheter was advanced to the neck of the pseudoaneurysm / area of extravasation and the distal aspect of the right uterine artery was coil embolized with multiple overlapping 4 and 3 mm diameter pushable coils. The micro catheter was withdrawn into the more proximal aspect of the right uterine artery and additional Gel-Foam slurry was administered for particle embolization. The micro catheter was removed and a repeat right internal iliac arteriogram was performed demonstrating complete occlusion of the right internal iliac artery without persistent supply to the area of extravasation.  The C2 catheter was then utilized to select of the ipsilateral left internal iliac artery and a selective left internal iliac arteriogram was performed in various obliquities.  At this point, the procedure was terminated. At this point, all wires, catheters and sheaths were removed from the patient. Hemostasis was achieved at the left groin access site with deployment of an Exoseal closure device. The patient tolerated the procedure well without immediate post procedural complication.  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  A right internal iliac and sub selective right uterine arteriograms were performed demonstrating supply to this pseudoaneurysm via a spasmodic right uterine artery.  Despite sub selective particle embolization of the right uterine artery with  Embospheres and Gel-Foam slurry, flow to the pseudoaneurysm persisted with ultimate frank extravasation into the pelvis. As such, the neck of the pseudoaneurysm / area of extravasation as well as the distal aspect of the right uterine artery were coil embolized with multiple overlapping pushable coils. The distal aspect of the right uterine artery was then further particle embolized with a small amount of additional Gel-Foam slurry.  Post right internal iliac arteriogram demonstrates complete occlusion of the right uterine artery without persistent flow to the pseudoaneurysm / area of extravasation.  Sub selective left internal iliac arteriogram was performed in various obliquities however failed to delineate a definitive  left-sided uterine artery. Additionally, there was no definitive supply to the pseudoaneurysm / area of extravasation within the midline of the pelvis. As such, a left-sided embolization was not performed.  IMPRESSION: 1. 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 2. Sub selective left internal iliac arteriogram failed to delineate a patent left-sided uterine artery or definitive supply to the pseudoaneurysm / area of extravasation and as such, a left-sided embolization was not performed. Critical Value/emergent results were discussed with Dr. Lavella Lemons PRATT at the completion of the procedure who verbally acknowledged these results.   Electronically Signed   By: Sandi Mariscal M.D.   On: 01/06/2014 08:35   Ir US Guide Vasc Access Left  01/06/2014   INDICATION: History of hysterectomy (12/17/2013), now with delayed post partum hemorrhage.  EXAM: 1. PELVIC ARTERIOGRAM 2. BILATERAL INTERNAL ILIAC ARTERIOGRAMS 3. SELECTIVE RIGHT UTERINE ARTERY ARTERIOGRAM AND PERCUTANEOUS COIL AND PARTICLE EMBOLIZATION 4. ULTRASOUND GUIDANCE FOR ARTERIAL ACCESS  MEDICATIONS: The patient is currently intubated and  on Propofol and Fentanyl drips. Patient was administered an additional 4 mg of Versed a during the procedure.  ANESTHESIA/SEDATION: Sedation time  100 minutes  CONTRAST:  160m OMNIPAQUE IOHEXOL 300 MG/ML  SOLN  COMPARISON:  [place] dated 01/05/2014  FLUOROSCOPY TIME:  29 minutes.  24 seconds.  PROCEDURE: Informed consent was obtained from the patient's family following explanation of the procedure, risks, benefits and alternatives. The patient's family understands, agrees and consents for the procedure. All questions were addressed. A time out was performed prior to the initiation of the procedure. Maximal barrier sterile technique utilized including caps, mask, sterile gowns, sterile gloves, large sterile drape, hand hygiene, and Betadine prep.  Given the presence of an existing right femoral central venous catheter, the decision was made to access the contralateral left common femoral artery. The left femoral head was marked fluoroscopically. Under sterile conditions and local anesthesia, the left common femoral artery access was performed with a micropuncture needle. Under direct ultrasound guidance, the right common femoral was accessed with a micropuncture kit. An ultrasound image was saved for documentation purposes. This allowed for placement of a 5-French vascular sheath. A limited arteriogram was performed through the side arm of the sheath confirming appropriate access within the left common femoral artery.  An Omniflush catheter was advanced just above the aortic bifurcation a pelvic arteriogram was performed. A stiff glide wire was advanced into the contralateral right common femoral artery and under intermittent fluoroscopic guidance, the Omni flush catheter was exchanged for a C2 catheter.  The C2 catheter was utilized to select the contralateral right internal iliac artery. Selective right internal iliac angiogram was performed. 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 Selective  catheterization was performed of the right uterine artery with a microcatheter and micro guide wire. 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  Given the spasmodic nature of the right uterine artery, the decision was initially made to perform particle embolization from this location. Particle embolization was initially performed with approximately 1/2 vial of 500 - 700 micron Embospheres, however secondary to suboptimal visualization of the embolic material, the right uterine artery was further particle embolized with Gelfoam slurry.  Despite administering what was deemed an adequate volume of particle embolization material, there was persistent flow to this pseudoaneurysm with ultimate extravasation into the pelvis. As such, the micro catheter was advanced to  the neck of the pseudoaneurysm / area of extravasation and the distal aspect of the right uterine artery was coil embolized with multiple overlapping 4 and 3 mm diameter pushable coils. The micro catheter was withdrawn into the more proximal aspect of the right uterine artery and additional Gel-Foam slurry was administered for particle embolization. The micro catheter was removed and a repeat right internal iliac arteriogram was performed demonstrating complete occlusion of the right internal iliac artery without persistent supply to the area of extravasation.  The C2 catheter was then utilized to select of the ipsilateral left internal iliac artery and a selective left internal iliac arteriogram was performed in various obliquities.  At this point, the procedure was terminated. At this point, all wires, catheters and sheaths were removed from the patient. Hemostasis was achieved at the left groin access site with deployment of an Exoseal closure device. The patient tolerated the procedure well without immediate post procedural complication.  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  A right internal iliac and sub selective right uterine arteriograms were performed demonstrating supply to this pseudoaneurysm via a spasmodic right uterine artery.  Despite sub selective particle embolization of the right uterine artery with Embospheres and Gel-Foam slurry, flow to the pseudoaneurysm persisted with ultimate frank extravasation into the pelvis. As such, the neck of the pseudoaneurysm / area of extravasation as well as the distal aspect of the right uterine artery were coil embolized with multiple overlapping pushable coils. The distal aspect of the right uterine artery was then further particle embolized with a small amount of additional Gel-Foam slurry.  Post right internal iliac arteriogram demonstrates complete occlusion of the right uterine artery without persistent flow to the pseudoaneurysm / area of extravasation.  Sub selective left internal iliac arteriogram was performed in various obliquities however failed to delineate a definitive left-sided uterine artery. Additionally, there was no definitive supply to the pseudoaneurysm / area of extravasation within the midline of the pelvis. As such, a left-sided embolization was not performed.  IMPRESSION: 1. 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 2. Sub selective left internal iliac arteriogram failed to delineate a patent left-sided uterine artery or definitive supply to the pseudoaneurysm / area of extravasation and as such, a left-sided embolization was not performed. Critical Value/emergent results were discussed with Dr. Lavella Lemons PRATT at the completion of the procedure who verbally acknowledged these results.   Electronically Signed   By: Sandi Mariscal M.D.   On: 01/06/2014 08:35   Dg Chest Port 1  View  01/06/2014   CLINICAL DATA:  Intubated.  Shortness of breath.  EXAM: PORTABLE CHEST - 1 VIEW  COMPARISON:  01/05/2014.  FINDINGS: Endotracheal tube ends in the lower thoracic trachea, at least 2 cm above the carina. Orogastric tube enters the stomach. Low volume lungs with mild basilar atelectasis. No effusion or pneumothorax.  IMPRESSION: 1. Endotracheal and orogastric tubes are in good position. 2. Low volume lungs with mild atelectasis.   Electronically Signed   By: Jorje Guild M.D.   On: 01/06/2014 06:02   Dg Chest Port 1 View  01/05/2014   CLINICAL DATA:  Intubation.  EXAM: PORTABLE CHEST - 1 VIEW  COMPARISON:  Chest x-ray 01/05/2014.  FINDINGS: Endotracheal tube and NG tube in good anatomic position. Low lung volumes. No focal infiltrate. Heart size stable. No pleural effusion or  pneumothorax. No acute osseous abnormality.  IMPRESSION: Stable tube positions.  Low lung volumes.  No acute abnormality .   Electronically Signed   By: Marcello Moores  Register   On: 01/05/2014 15:12   Ir Ileana Ladd Art  [place] [employer]  01/06/2014   INDICATION: History of hysterectomy (12/17/2013), now with delayed post partum hemorrhage.  EXAM: 1. PELVIC ARTERIOGRAM 2. BILATERAL INTERNAL ILIAC ARTERIOGRAMS 3. SELECTIVE RIGHT UTERINE ARTERY ARTERIOGRAM AND PERCUTANEOUS COIL AND PARTICLE EMBOLIZATION 4. ULTRASOUND GUIDANCE FOR ARTERIAL ACCESS  MEDICATIONS: The patient is currently intubated and on Propofol and Fentanyl drips. Patient was administered an additional 4 mg of Versed a during the procedure.  ANESTHESIA/SEDATION: Sedation time  100 minutes  CONTRAST:  177m OMNIPAQUE IOHEXOL 300 MG/ML  SOLN  COMPARISON:  [place] dated 01/05/2014  FLUOROSCOPY TIME:  29 minutes.  24 seconds.  PROCEDURE: Informed consent was obtained from the patient's family following explanation of the procedure, risks, benefits and alternatives. The patient's family understands, agrees and consents for the  procedure. All questions were addressed. A time out was performed prior to the initiation of the procedure. Maximal barrier sterile technique utilized including caps, mask, sterile gowns, sterile gloves, large sterile drape, hand hygiene, and Betadine prep.  Given the presence of an existing right femoral central venous catheter, the decision was made to access the contralateral left common femoral artery. The left femoral head was marked fluoroscopically. Under sterile conditions and local anesthesia, the left common femoral artery access was performed with a micropuncture needle. Under direct ultrasound guidance, the right common femoral was accessed with a micropuncture kit. An ultrasound image was saved for documentation purposes. This allowed for placement of a 5-French vascular sheath. A limited arteriogram was performed through the side arm of the sheath confirming appropriate access within the left common femoral artery.  An Omniflush catheter was advanced just above the aortic bifurcation a pelvic arteriogram was performed. A stiff glide wire was advanced into the contralateral right common femoral artery and under intermittent fluoroscopic guidance, the Omni flush catheter was exchanged for a C2 catheter.  The C2 catheter was utilized to select the contralateral right internal iliac artery. Selective right internal iliac angiogram was performed. 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 Selective catheterization was performed of the right uterine artery with a microcatheter and micro guide wire. 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  Given the spasmodic nature of the right uterine artery, the decision was initially made to perform particle embolization from this location. Particle embolization was initially performed with approximately 1/2 vial of 500 - 700 micron Embospheres, however secondary to  suboptimal visualization of the embolic material, the right uterine artery was further particle embolized with Gelfoam slurry.  Despite administering what was deemed an adequate volume of particle embolization material, there was persistent flow to this pseudoaneurysm with ultimate extravasation into the pelvis. As such, the micro catheter was advanced to the neck of the pseudoaneurysm / area of extravasation and the distal aspect of the right uterine artery was coil embolized with multiple overlapping 4 and 3 mm diameter pushable coils. The micro catheter was withdrawn into the more proximal aspect of the right uterine artery and additional Gel-Foam slurry was administered for particle embolization. The micro catheter was removed and a repeat right internal iliac arteriogram was performed demonstrating complete occlusion of the right internal iliac artery without persistent supply to  the area of extravasation.  The C2 catheter was then utilized to select of the ipsilateral left internal iliac artery and a selective left internal iliac arteriogram was performed in various obliquities.  At this point, the procedure was terminated. At this point, all wires, catheters and sheaths were removed from the patient. Hemostasis was achieved at the left groin access site with deployment of an Exoseal closure device. The patient tolerated the procedure well without immediate post procedural complication.  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  A right internal iliac and sub selective right uterine arteriograms were performed demonstrating supply to this pseudoaneurysm via a spasmodic right uterine artery.  Despite sub selective particle embolization of the right uterine artery with Embospheres and Gel-Foam slurry, flow to the pseudoaneurysm persisted with ultimate frank extravasation into the pelvis. As such, the  neck of the pseudoaneurysm / area of extravasation as well as the distal aspect of the right uterine artery were coil embolized with multiple overlapping pushable coils. The distal aspect of the right uterine artery was then further particle embolized with a small amount of additional Gel-Foam slurry.  Post right internal iliac arteriogram demonstrates complete occlusion of the right uterine artery without persistent flow to the pseudoaneurysm / area of extravasation.  Sub selective left internal iliac arteriogram was performed in various obliquities however failed to delineate a definitive left-sided uterine artery. Additionally, there was no definitive supply to the pseudoaneurysm / area of extravasation within the midline of the pelvis. As such, a left-sided embolization was not performed.  IMPRESSION: 1. 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 2. Sub selective left internal iliac arteriogram failed to delineate a patent left-sided uterine artery or definitive supply to the pseudoaneurysm / area of extravasation and as such, a left-sided embolization was not performed. Critical Value/emergent results were discussed with Dr. Lavella Lemons PRATT at the completion of the procedure who verbally acknowledged these results.   Electronically Signed   By: Sandi Mariscal M.D.   On: 01/06/2014 08:35    Anti-infectives: Anti-infectives   None      Assessment/Plan: s/p * No surgery found *  Uterine bleeding- delayed hemorrhage post hysterectomy 12/17/13 Stable/on vent H/H stable   LOS: 1 day    Erika Wright A 01/06/2014

## 2014-01-06 NOTE — Progress Notes (Signed)
Pt arrived back from IR.  Upon assessment, softball size blood clot found on pad and several smaller quarter size clots.  MD made aware.  Clean pads applied.  Will continue to closely assess output.

## 2014-01-06 NOTE — Progress Notes (Signed)
CRITICAL VALUE ALERT  Critical value received:  Hgb=6.5  Date of notification:  01/06/14  Time of notification:  0108  Critical value read back:yes  Nurse who received alert:  Berkley Harveyhristine Phuc Kluttz, RN  MD notified (1st page):  Dr. Martina SinnerEJiro  Time of first page:  0109  MD notified (2nd page):  Time of second page:  Responding MD:  Dr. Martina SinnerEJiro  Time MD responded:  0111

## 2014-01-06 NOTE — Progress Notes (Signed)
UR Completed.  Tashika Goodin Jane [phone] 01/06/2014  

## 2014-01-06 NOTE — Progress Notes (Signed)
1700 200mcg Fentanyl wasted in sink. Witnessed by Aleda GranaKellie Hunt, RN.

## 2014-01-07 ENCOUNTER — Inpatient Hospital Stay (HOSPITAL_COMMUNITY): Payer: Medicaid Other

## 2014-01-07 LAB — BASIC METABOLIC PANEL
BUN: 7 mg/dL (ref 6–23)
CHLORIDE: 111 meq/L (ref 96–112)
CO2: 19 mEq/L (ref 19–32)
Calcium: 7.6 mg/dL — ABNORMAL LOW (ref 8.4–10.5)
Creatinine, Ser: 0.86 mg/dL (ref 0.50–1.10)
GFR calc non Af Amer: >90 mL/min (ref >90)
Glucose, Bld: 84 mg/dL (ref 70–99)
Potassium: 3.9 mEq/L (ref 3.7–5.3)
Sodium: 141 mEq/L (ref 137–147)

## 2014-01-07 LAB — TYPE AND SCREEN
ABO/RH(D): B POS
ANTIBODY SCREEN: NEGATIVE
UNIT DIVISION: 0
UNIT DIVISION: 0
Unit division: 0
Unit division: 0

## 2014-01-07 LAB — BLOOD GAS, ARTERIAL
Acid-base deficit: 4.7 mmol/L — ABNORMAL HIGH (ref 0.0–2.0)
BICARBONATE: 19.3 meq/L — AB (ref 20.0–24.0)
Drawn by: 40530
FIO2: 0.21 %
O2 Saturation: 94.8 %
PATIENT TEMPERATURE: 98.6
PH ART: 7.394 (ref 7.350–7.450)
TCO2: 20.3 mmol/L (ref 0–100)
pCO2 arterial: 32.3 mmHg — ABNORMAL LOW (ref 35.0–45.0)
pO2, Arterial: 71.1 mmHg — ABNORMAL LOW (ref 80.0–100.0)

## 2014-01-07 LAB — PREPARE FRESH FROZEN PLASMA
UNIT DIVISION: 0
Unit division: 0

## 2014-01-07 LAB — CBC
HEMATOCRIT: 21.3 % — AB (ref 36.0–46.0)
HEMATOCRIT: 24.6 % — AB (ref 36.0–46.0)
HEMOGLOBIN: 8.4 g/dL — AB (ref 12.0–15.0)
Hemoglobin: 7.5 g/dL — ABNORMAL LOW (ref 12.0–15.0)
MCH: 29.2 pg (ref 26.0–34.0)
MCH: 28.9 pg (ref 26.0–34.0)
MCHC: 35.2 g/dL (ref 30.0–36.0)
MCHC: 34.1 g/dL (ref 30.0–36.0)
MCV: 82.9 fL (ref 78.0–100.0)
MCV: 84.5 fL (ref 78.0–100.0)
Platelets: 167 10*3/uL (ref 150–400)
Platelets: 215 10*3/uL (ref 150–400)
RBC: 2.57 MIL/uL — ABNORMAL LOW (ref 3.87–5.11)
RBC: 2.91 MIL/uL — ABNORMAL LOW (ref 3.87–5.11)
RDW: 15.9 % — AB (ref 11.5–15.5)
RDW: 15.8 % — AB (ref 11.5–15.5)
WBC: 13 10*3/uL — AB (ref 4.0–10.5)
WBC: 14.6 10*3/uL — ABNORMAL HIGH (ref 4.0–10.5)

## 2014-01-07 LAB — MAGNESIUM: MAGNESIUM: 2 mg/dL (ref 1.5–2.5)

## 2014-01-07 LAB — PHOSPHORUS: Phosphorus: 2.9 mg/dL (ref 2.3–4.6)

## 2014-01-07 NOTE — Progress Notes (Signed)
Postop day 21.s/p cesarean, pod 2 s/p D&C, s/p ex lap, repair posterior uterine perforation and IR [place].  Subjective: Pt in impressive good condition, using bedside commode with assistance. Taking PO reg diet.   Patient reports ruq discomfort , moderate, controlled with oral analgesics. Bleeding lite pink vag d/c, no clots, no Bright blood. A.    Objective: I have reviewed patient's vital signs, medications and labs. Pt remains normotensive, pulse in 110's, urine output good. Foley out. Pt was BREAST FEEDING, , and wants to try to pump, to see if she can hold on to producing milk, : [facility].  General: alert, cooperative, appears stated age and mild distress Resp: clear to auscultation bilaterally GI: soft, non-tender; bowel sounds normal; no masses,  no organomegaly and incision: clean, dry and dressing in place , no bleeding no drainiage Extremities: extremities normal, atraumatic, no cyanosis or edema and Homans sign is negative, no sign of DVT Vaginal Bleeding: minimal  Assessment: DOING QUITE WELL  S/p Ex Lap ([place] ) for late postsurgical hemorrhage, s/p multiple transfusions, at least 12 u prbc, plus plts, FFP, cryo,  Now s/p IR embolization of Rt uterine artery day 2   stable, progressing well, tolerating diet and anemia  Plan: would suggest one more unit prbc to see if pulse declines to less than 100, and get hgb above 8. Agree with plan to consider move out of ICU tomorrow.  would prefer for us to come to pt , rather [facility]  Upon d/c from icu to med-surg floor  We will continue to see daily. I will arrange with Peds [phone]) to allow breast pump to be signed out [facility].   LOS: 2 days    Erika Wright V 01/07/2014, 11:25 AM

## 2014-01-07 NOTE — Progress Notes (Signed)
PULMONARY / CRITICAL CARE MEDICINE  Name: Erika Wright MRN: [id number] DOB: [birth year]    ADMISSION DATE:  01/05/2014 CONSULTATION DATE:  01/05/2014  REFERRING MD :  Dr. Manfred Shirts  PRIMARY SERVICE: Pulmonary/Critical Care  CHIEF COMPLAINT:  Bleeding/Unresponsiveness  BRIEF PATIENT DESCRIPTION: Obese 22y.o. [place] female G3P3 only history of "leaky heart valve" delivered by c-section 12/17/13 crit 24.4 returned 1/12 with hemorrhage s/p 2 D and C at [facility], IR embolization at [facility] with DIC and shock.  SIGNIFICANT EVENTS / STUDIES:  1/12  To ED, uncontrolled vaginal bleeding, D&C x 2, repair uterine tearintubated, cortis placed 1/13  Continued bleeding, tx to [facility] 8 units PRBC, FFP 6 Plat 2 1 cryo 1/13  IR embolization of uterine vessels without ability to find vessel in hemorrhagic region  LINES / TUBES: R femoral cortis 1/12 >>> 1/15 Foley 1/12 >>> ETT 1/12 >>>1/14 NGT 1/12 >>>1/14  CULTURES: 1/13  Urine >>>  ANTIBIOTICS: Clindamycin 1/12 >>> 1/13 Ampicillin 1/12 >>> 1/13 Zosyn 1/13 >>>   SUBJECTIVE: Extubated yesterday, slept well. Still with abdominal pain and tenderness. Would like to try eating today.   VITAL SIGNS: Temp:  [98.8 F (37.1 C)-101 F (38.3 C)] 100.5 F (38.1 C) (01/14 2330) Pulse Rate:  [109-129] 109 (01/15 0600) Resp:  [0-34] 32 (01/15 0600) BP: (105-142)/(57-87) 118/64 mmHg (01/15 0600) SpO2:  [97 %-100 %] 98 % (01/15 0600) FiO2 (%):  [40 %] 40 % (01/14 1158) Weight:  [219 lb 12.8 oz (99.7 kg)] 219 lb 12.8 oz (99.7 kg) (01/15 0400)  HEMODYNAMICS:   VENTILATOR SETTINGS: Vent Mode:  [-] PSV FiO2 (%):  [40 %] 40 % PEEP:  [5 cmH20] 5 cmH20 Pressure Support:  [5 cmH20] 5 cmH20  INTAKE / OUTPUT: Intake/Output     01/14 0701 - 01/15 0700 01/15 0701 - 01/16 0700   P.O. 240    I.V. (mL/kg) 714 (7.2)    Blood 351.7    IV Piggyback 1000    Total Intake(mL/kg) 2305.7 (23.1)    Urine (mL/kg/hr) 1675 (0.7)    Total Output 1675     Net  +630.7            PHYSICAL EXAMINATION: General:  Obese, sleepy, pleasany Neuro:  PERRLA, follows commands, answers questions appropriately HEENT:  PERRLA Cardiovascular:  Regular rate, tachy 100's, no murmur appreciated Lungs:  Mostly clear bilaterally, good air movement, no increased WOB Abdomen:  Distended, hypoactive bowel sounds, minimal to moderate tender Musculoskeletal:  Moves all extremities Skin:  Intact, C/S site with intact dry/clean dressing  LABS:  CBC  Recent Labs Lab 01/06/14 1014 01/06/14 1700 01/07/14 0445  WBC 15.0* 13.7* 13.0*  HGB 7.4* 6.6* 7.5*  HCT 20.9* 18.7* 21.3*  PLT 153 153 167   Coag's  Recent Labs Lab 01/05/14 1333 01/06/14 0630  APTT 29 30  INR 1.42  --    BMET  Recent Labs Lab 01/05/14 1333 01/06/14 0530 01/07/14 0445  NA 137 141 141  K 4.4 4.2 3.9  CL 109 113* 111  CO2 18* 17* 19  BUN 9 10 7   CREATININE 1.06 1.13* 0.86  GLUCOSE 132* 109* 84   Electrolytes  Recent Labs Lab 01/05/14 1333 01/06/14 0530 01/07/14 0445  CALCIUM 6.2* 6.9* 7.6*  MG 3.2*  --  2.0  PHOS 3.9  --  2.9   Sepsis Markers  Recent Labs Lab 01/05/14 1334  LATICACIDVEN 1.6   ABG  Recent Labs Lab 01/06/14 0100 01/06/14 0401 01/07/14 0410  PHART 7.362  7.369 7.394  PCO2ART 32.6* 29.7* 32.3*  PO2ART 137.0* 153.0* 71.1*   Liver Enzymes  Recent Labs Lab 01/05/14 1333  AST 21  ALT 13  ALKPHOS 43  BILITOT 0.6  ALBUMIN 1.7*   Cardiac Enzymes No results found for this basename: TROPONINI, PROBNP,  in the last 168 hours Glucose  Recent Labs Lab 01/05/14 1533 01/05/14 1904 01/06/14 0007 01/06/14 0358 01/06/14 0750 01/06/14 1205  GLUCAP 112* 125* 119* 124* 109* 103*   CXR:  No new  ASSESSMENT / PLAN:  PULMONARY A:  Acute respiratory failure in setting of hemorrhagic shock. Resolved. P:   SpO2>92 Supplemental oxygen PRN  CARDIOVASCULAR A:  Hemorrhagic shock, resolved. Tachycardia. "Leaky heart valve" P:  Goal  MAP > 60 TTE Pull fem line today  RENAL A:   AKI, resolved. Mild metabolic acidosis. P:   Trend BMP  GASTROINTESTINAL A:   GI Px is not indicated. Nutrition P:   Diet as tolerated  HEMATOLOGIC A:   Hemmorhagic shock resolving. Uterine hemorrhage. DVT Px. P:  OB/GYN following Goal Hg 7 CBC q12h Pull femoral cordis today SCDs  INFECTIOUS A:   Suspected endometritis. P:   Abx as above Follow path to ensure no retained products  ENDOCRINE A:   No active issues. P:   No intervention required  NEUROLOGIC A:   Acute encephalopathy, resolved. P:   Oxy IR prn for pain  01/07/2014, 7:20 AM Erika Wright, Erika Wright  I have personally obtained history, examined patient, evaluated and interpreted laboratory and imaging results, reviewed medical records, formulated assessment / plan and placed orders.  Erika Wright, Erika Plemmons, MD Pulmonary and Critical Care Medicine [facility] Pager: [phone]  01/07/2014, 8:32 AM

## 2014-01-07 NOTE — Progress Notes (Signed)
Subjective: Pt feeling better; still with some lower abd discomfort; on bedside commode; has had sig decrease in amt of bleeding; some blood tinged urine noted; received 1 unit PRBC's last night  Objective: Vital signs in last 24 hours: Temp:  [98.3 F (36.8 C)-101 F (38.3 C)] 98.3 F (36.8 C) (01/15 0847) Pulse Rate:  [109-129] 111 (01/15 0900) Resp:  [0-34] 22 (01/15 0900) BP: (114-142)/(59-89) 134/72 mmHg (01/15 1000) SpO2:  [97 %-100 %] 100 % (01/15 0900) FiO2 (%):  [40 %] 40 % (01/14 1158) Weight:  [219 lb 12.8 oz (99.7 kg)] 219 lb 12.8 oz (99.7 kg) (01/15 0400) Last BM Date: 01/02/14 (pt reported)  Intake/Output from previous day: 01/14 0701 - 01/15 0700 In: 2305.7 [P.O.:240; I.V.:714; Blood:351.7; IV Piggyback:1000] Out: 5462 [Urine:1675] Intake/Output this shift: Total I/O In: 60 [I.V.:60] Out: 250 [Urine:250]  Pt awake/alert; left groin site ok, soft, dressing in place  Lab Results:   Recent Labs  01/06/14 1700 01/07/14 0445  WBC 13.7* 13.0*  HGB 6.6* 7.5*  HCT 18.7* 21.3*  PLT 153 167   BMET  Recent Labs  01/06/14 0530 01/07/14 0445  NA 141 141  K 4.2 3.9  CL 113* 111  CO2 17* 19  GLUCOSE 109* 84  BUN 10 7  CREATININE 1.13* 0.86  CALCIUM 6.9* 7.6*   PT/INR  Recent Labs  01/05/14 1333  LABPROT 17.0*  INR 1.42   ABG  Recent Labs  01/06/14 0401 01/07/14 0410  PHART 7.369 7.394  HCO3 16.7* 19.3*    Studies/Results: Ir Angiogram Pelvis Selective Or Supraselective  01/06/2014   INDICATION: History of hysterectomy (12/17/2013), now with delayed post partum hemorrhage.  EXAM: 1. PELVIC ARTERIOGRAM 2. BILATERAL INTERNAL ILIAC ARTERIOGRAMS 3. SELECTIVE RIGHT UTERINE ARTERY ARTERIOGRAM AND PERCUTANEOUS COIL AND PARTICLE EMBOLIZATION 4. ULTRASOUND GUIDANCE FOR ARTERIAL ACCESS  MEDICATIONS: The patient is currently intubated and on Propofol and Fentanyl drips. Patient was administered an additional 4 mg of Versed a during the procedure.   ANESTHESIA/SEDATION: Sedation time  100 minutes  CONTRAST:  172m OMNIPAQUE IOHEXOL 300 MG/ML  SOLN  COMPARISON:  [place] COMPLETE dated 01/05/2014  FLUOROSCOPY TIME:  29 minutes.  24 seconds.  PROCEDURE: Informed consent was obtained from the patient's family following explanation of the procedure, risks, benefits and alternatives. The patient's family understands, agrees and consents for the procedure. All questions were addressed. A time out was performed prior to the initiation of the procedure. Maximal barrier sterile technique utilized including caps, mask, sterile gowns, sterile gloves, large sterile drape, hand hygiene, and Betadine prep.  Given the presence of an existing right femoral central venous catheter, the decision was made to access the contralateral left common femoral artery. The left femoral head was marked fluoroscopically. Under sterile conditions and local anesthesia, the left common femoral artery access was performed with a micropuncture needle. Under direct ultrasound guidance, the right common femoral was accessed with a micropuncture kit. An ultrasound image was saved for documentation purposes. This allowed for placement of a 5-French vascular sheath. A limited arteriogram was performed through the side arm of the sheath confirming appropriate access within the left common femoral artery.  An Omniflush catheter was advanced just above the aortic bifurcation a pelvic arteriogram was performed. A stiff glide wire was advanced into the contralateral right common femoral artery and under intermittent fluoroscopic guidance, the Omni flush catheter was exchanged for a C2 catheter.  The C2 catheter was utilized to select the contralateral right internal iliac artery. Selective  right internal iliac angiogram was performed. 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 Selective catheterization was performed of the right uterine artery with a microcatheter and micro guide wire. 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  Given the spasmodic nature of the right uterine artery, the decision was initially made to perform particle embolization from this location. Particle embolization was initially performed with approximately 1/2 vial of 500 - 700 micron Embospheres, however secondary to suboptimal visualization of the embolic material, the right uterine artery was further particle embolized with Gelfoam slurry.  Despite administering what was deemed an adequate volume of particle embolization material, there was persistent flow to this pseudoaneurysm with ultimate extravasation into the pelvis. As such, the micro catheter was advanced to the neck of the pseudoaneurysm / area of extravasation and the distal aspect of the right uterine artery was coil embolized with multiple overlapping 4 and 3 mm diameter pushable coils. The micro catheter was withdrawn into the more proximal aspect of the right uterine artery and additional Gel-Foam slurry was administered for particle embolization. The micro catheter was removed and a repeat right internal iliac arteriogram was performed demonstrating complete occlusion of the right internal iliac artery without persistent supply to the area of extravasation.  The C2 catheter was then utilized to select of the ipsilateral left internal iliac artery and a selective left internal iliac arteriogram was performed in various obliquities.  At this point, the procedure was terminated. At this point, all wires, catheters and sheaths were removed from the patient. Hemostasis was achieved at the left groin access site with deployment of an Exoseal closure device. The patient tolerated the procedure well without immediate post procedural complication.  COMPLICATIONS: None immediate  FINDINGS: Initial pelvic arteriogram demonstrates an ill-defined large pseudoaneurysm within the midline of  the pelvis.  A right internal iliac and sub selective right uterine arteriograms were performed demonstrating supply to this pseudoaneurysm via a spasmodic right uterine artery.  Despite sub selective particle embolization of the right uterine artery with Embospheres and Gel-Foam slurry, flow to the pseudoaneurysm persisted with ultimate frank extravasation into the pelvis. As such, the neck of the pseudoaneurysm / area of extravasation as well as the distal aspect of the right uterine artery were coil embolized with multiple overlapping pushable coils. The distal aspect of the right uterine artery was then further particle embolized with a small amount of additional Gel-Foam slurry.  Post right internal iliac arteriogram demonstrates complete occlusion of the right uterine artery without persistent flow to the pseudoaneurysm / area of extravasation.  Sub selective left internal iliac arteriogram was performed in various obliquities however failed to delineate a definitive left-sided uterine artery. Additionally, there was no definitive supply to the pseudoaneurysm / area of extravasation within the midline of the pelvis. As such, a left-sided embolization was not performed.  IMPRESSION: 1. 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 2. Sub selective left internal iliac arteriogram failed to delineate a patent left-sided uterine artery or definitive supply to the pseudoaneurysm / area of extravasation and as such, a left-sided embolization was not performed. Critical Value/emergent results were discussed with Dr. Lavella Lemons PRATT at the completion of the procedure who verbally acknowledged these results.   Electronically Signed   By: Sandi Mariscal M.D.   On: 01/06/2014 08:35   Ir Angiogram Pelvis Selective Or Supraselective  01/06/2014   INDICATION: History of hysterectomy (12/17/2013), now with delayed post  partum hemorrhage.  EXAM: 1. PELVIC ARTERIOGRAM 2. BILATERAL INTERNAL ILIAC ARTERIOGRAMS 3. SELECTIVE RIGHT UTERINE ARTERY ARTERIOGRAM AND PERCUTANEOUS COIL AND PARTICLE EMBOLIZATION 4. ULTRASOUND GUIDANCE FOR ARTERIAL ACCESS  MEDICATIONS: The patient is currently intubated and on Propofol and Fentanyl drips. Patient was administered an additional 4 mg of Versed a during the procedure.  ANESTHESIA/SEDATION: Sedation time  100 minutes  CONTRAST:  OMNIPAQUE IOHEXOL 300 MG/ML  SOLN  COMPARISON:  US PELVIS COMPLETE dated 01/05/2014  FLUOROSCOPY TIME:  29 minutes.  24 seconds.  PROCEDURE: Informed consent was obtained from the patient's family following explanation of the procedure, risks, benefits and alternatives. The patient's family understands, agrees and consents for the procedure. All questions were addressed. A time out was performed prior to the initiation of the procedure. Maximal barrier sterile technique utilized including caps, mask, sterile gowns, sterile gloves, large sterile drape, hand hygiene, and Betadine prep.  Given the presence of an existing right femoral central venous catheter, the decision was made to access the contralateral left common femoral artery. The left femoral head was marked fluoroscopically. Under sterile conditions and local anesthesia, the left common femoral artery access was performed with a micropuncture needle. Under direct ultrasound guidance, the right common femoral was accessed with a micropuncture kit. An ultrasound image was saved for documentation purposes. This allowed for placement of a 5-French vascular sheath. A limited arteriogram was performed through the side arm of the sheath confirming appropriate access within the left common femoral artery.  An Omniflush catheter was advanced just above the aortic bifurcation a pelvic arteriogram was performed. A stiff glide wire was advanced into the contralateral right common femoral artery and under intermittent  fluoroscopic guidance, the Omni flush catheter was exchanged for a C2 catheter.  The C2 catheter was utilized to select the contralateral right internal iliac artery. Selective right internal iliac angiogram was performed. 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 Selective catheterization was performed of the right uterine artery with a microcatheter and micro guide wire. 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  Given the spasmodic nature of the right uterine artery, the decision was initially made to perform particle embolization from this location. Particle embolization was initially performed with approximately 1/2 vial of 500 - 700 micron Embospheres, however secondary to suboptimal visualization of the embolic material, the right uterine artery was further particle embolized with Gelfoam slurry.  Despite administering what was deemed an adequate volume of particle embolization material, there was persistent flow to this pseudoaneurysm with ultimate extravasation into the pelvis. As such, the micro catheter was advanced to the neck of the pseudoaneurysm / area of extravasation and the distal aspect of the right uterine artery was coil embolized with multiple overlapping 4 and 3 mm diameter pushable coils. The micro catheter was withdrawn into the more proximal aspect of the right uterine artery and additional Gel-Foam slurry was administered for particle embolization. The micro catheter was removed and a repeat right internal iliac arteriogram was performed demonstrating complete occlusion of the right internal iliac artery without persistent supply to the area of extravasation.  The C2 catheter was then utilized to select of the ipsilateral left internal iliac artery and a selective left internal iliac arteriogram was performed in various obliquities.  At this point, the procedure was terminated. At this  point, all wires, catheters and sheaths  were removed from the patient. Hemostasis was achieved at the left groin access site with deployment of an Exoseal closure device. The patient tolerated the procedure well without immediate post procedural complication.  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  A right internal iliac and sub selective right uterine arteriograms were performed demonstrating supply to this pseudoaneurysm via a spasmodic right uterine artery.  Despite sub selective particle embolization of the right uterine artery with Embospheres and Gel-Foam slurry, flow to the pseudoaneurysm persisted with ultimate frank extravasation into the pelvis. As such, the neck of the pseudoaneurysm / area of extravasation as well as the distal aspect of the right uterine artery were coil embolized with multiple overlapping pushable coils. The distal aspect of the right uterine artery was then further particle embolized with a small amount of additional Gel-Foam slurry.  Post right internal iliac arteriogram demonstrates complete occlusion of the right uterine artery without persistent flow to the pseudoaneurysm / area of extravasation.  Sub selective left internal iliac arteriogram was performed in various obliquities however failed to delineate a definitive left-sided uterine artery. Additionally, there was no definitive supply to the pseudoaneurysm / area of extravasation within the midline of the pelvis. As such, a left-sided embolization was not performed.  IMPRESSION: 1. 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 2. Sub selective left internal iliac arteriogram failed to delineate a patent left-sided uterine artery or definitive supply to the pseudoaneurysm / area of extravasation and as such, a  left-sided embolization was not performed. Critical Value/emergent results were discussed with Dr. Lavella Lemons PRATT at the completion of the procedure who verbally acknowledged these results.   Electronically Signed   By: Sandi Mariscal M.D.   On: 01/06/2014 08:35   Ir Angiogram Selective Each Additional Vessel  01/06/2014   INDICATION: History of hysterectomy (12/17/2013), now with delayed post partum hemorrhage.  EXAM: 1. PELVIC ARTERIOGRAM 2. BILATERAL INTERNAL ILIAC ARTERIOGRAMS 3. SELECTIVE RIGHT UTERINE ARTERY ARTERIOGRAM AND PERCUTANEOUS COIL AND PARTICLE EMBOLIZATION 4. ULTRASOUND GUIDANCE FOR ARTERIAL ACCESS  MEDICATIONS: The patient is currently intubated and on Propofol and Fentanyl drips. Patient was administered an additional 4 mg of Versed a during the procedure.  ANESTHESIA/SEDATION: Sedation time  100 minutes  CONTRAST:  160m OMNIPAQUE IOHEXOL 300 MG/ML  SOLN  COMPARISON:  [place] COMPLETE dated 01/05/2014  FLUOROSCOPY TIME:  29 minutes.  24 seconds.  PROCEDURE: Informed consent was obtained from the patient's family following explanation of the procedure, risks, benefits and alternatives. The patient's family understands, agrees and consents for the procedure. All questions were addressed. A time out was performed prior to the initiation of the procedure. Maximal barrier sterile technique utilized including caps, mask, sterile gowns, sterile gloves, large sterile drape, hand hygiene, and Betadine prep.  Given the presence of an existing right femoral central venous catheter, the decision was made to access the contralateral left common femoral artery. The left femoral head was marked fluoroscopically. Under sterile conditions and local anesthesia, the left common femoral artery access was performed with a micropuncture needle. Under direct ultrasound guidance, the right common femoral was accessed with a micropuncture kit. An ultrasound image was saved for documentation purposes. This allowed for  placement of a 5-French vascular sheath. A limited arteriogram was performed through the side arm of the sheath confirming appropriate access within the left common femoral artery.  An  Omniflush catheter was advanced just above the aortic bifurcation a pelvic arteriogram was performed. A stiff glide wire was advanced into the contralateral right common femoral artery and under intermittent fluoroscopic guidance, the Omni flush catheter was exchanged for a C2 catheter.  The C2 catheter was utilized to select the contralateral right internal iliac artery. Selective right internal iliac angiogram was performed. 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 Selective catheterization was performed of the right uterine artery with a microcatheter and micro guide wire. 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  Given the spasmodic nature of the right uterine artery, the decision was initially made to perform particle embolization from this location. Particle embolization was initially performed with approximately 1/2 vial of 500 - 700 micron Embospheres, however secondary to suboptimal visualization of the embolic material, the right uterine artery was further particle embolized with Gelfoam slurry.  Despite administering what was deemed an adequate volume of particle embolization material, there was persistent flow to this pseudoaneurysm with ultimate extravasation into the pelvis. As such, the micro catheter was advanced to the neck of the pseudoaneurysm / area of extravasation and the distal aspect of the right uterine artery was coil embolized with multiple overlapping 4 and 3 mm diameter pushable coils. The micro catheter was withdrawn into the more proximal aspect of the right uterine artery and additional Gel-Foam slurry was administered for particle embolization. The micro catheter was removed and a repeat right  internal iliac arteriogram was performed demonstrating complete occlusion of the right internal iliac artery without persistent supply to the area of extravasation.  The C2 catheter was then utilized to select of the ipsilateral left internal iliac artery and a selective left internal iliac arteriogram was performed in various obliquities.  At this point, the procedure was terminated. At this point, all wires, catheters and sheaths were removed from the patient. Hemostasis was achieved at the left groin access site with deployment of an Exoseal closure device. The patient tolerated the procedure well without immediate post procedural complication.  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  A right internal iliac and sub selective right uterine arteriograms were performed demonstrating supply to this pseudoaneurysm via a spasmodic right uterine artery.  Despite sub selective particle embolization of the right uterine artery with Embospheres and Gel-Foam slurry, flow to the pseudoaneurysm persisted with ultimate frank extravasation into the pelvis. As such, the neck of the pseudoaneurysm / area of extravasation as well as the distal aspect of the right uterine artery were coil embolized with multiple overlapping pushable coils. The distal aspect of the right uterine artery was then further particle embolized with a small amount of additional Gel-Foam slurry.  Post right internal iliac arteriogram demonstrates complete occlusion of the right uterine artery without persistent flow to the pseudoaneurysm / area of extravasation.  Sub selective left internal iliac arteriogram was performed in various obliquities however failed to delineate a definitive left-sided uterine artery. Additionally, there was no definitive supply to the pseudoaneurysm / area of extravasation within the midline of the pelvis. As such, a  left-sided embolization was not performed.  IMPRESSION: 1. 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 2. Sub selective left internal iliac arteriogram failed to delineate a patent left-sided uterine artery or definitive supply to the pseudoaneurysm /  area of extravasation and as such, a left-sided embolization was not performed. Critical Value/emergent results were discussed with Dr. Kenney Houseman PRATT at the completion of the procedure who verbally acknowledged these results.   Electronically Signed   By: Simonne Come M.D.   On: 01/06/2014 08:35   Ir Angiogram Selective Each Additional Vessel  01/06/2014   INDICATION: History of hysterectomy (12/17/2013), now with delayed post partum hemorrhage.  EXAM: 1. PELVIC ARTERIOGRAM 2. BILATERAL INTERNAL ILIAC ARTERIOGRAMS 3. SELECTIVE RIGHT UTERINE ARTERY ARTERIOGRAM AND PERCUTANEOUS COIL AND PARTICLE EMBOLIZATION 4. ULTRASOUND GUIDANCE FOR ARTERIAL ACCESS  MEDICATIONS: The patient is currently intubated and on Propofol and Fentanyl drips. Patient was administered an additional 4 mg of Versed a during the procedure.  ANESTHESIA/SEDATION: Sedation time  100 minutes  CONTRAST:  OMNIPAQUE IOHEXOL 300 MG/ML  SOLN  COMPARISON:  US PELVIS COMPLETE dated 01/05/2014  FLUOROSCOPY TIME:  29 minutes.  24 seconds.  PROCEDURE: Informed consent was obtained from the patient's family following explanation of the procedure, risks, benefits and alternatives. The patient's family understands, agrees and consents for the procedure. All questions were addressed. A time out was performed prior to the initiation of the procedure. Maximal barrier sterile technique utilized including caps, mask, sterile gowns, sterile gloves, large sterile drape, hand hygiene, and Betadine prep.  Given the presence of an existing right femoral central venous catheter, the decision was made to access the  contralateral left common femoral artery. The left femoral head was marked fluoroscopically. Under sterile conditions and local anesthesia, the left common femoral artery access was performed with a micropuncture needle. Under direct ultrasound guidance, the right common femoral was accessed with a micropuncture kit. An ultrasound image was saved for documentation purposes. This allowed for placement of a 5-French vascular sheath. A limited arteriogram was performed through the side arm of the sheath confirming appropriate access within the left common femoral artery.  An Omniflush catheter was advanced just above the aortic bifurcation a pelvic arteriogram was performed. A stiff glide wire was advanced into the contralateral right common femoral artery and under intermittent fluoroscopic guidance, the Omni flush catheter was exchanged for a C2 catheter.  The C2 catheter was utilized to select the contralateral right internal iliac artery. Selective right internal iliac angiogram was performed. 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 Selective catheterization was performed of the right uterine artery with a microcatheter and micro guide wire. 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  Given the spasmodic nature of the right uterine artery, the decision was initially made to perform particle embolization from this location. Particle embolization was initially performed with approximately 1/2 vial of 500 - 700 micron Embospheres, however secondary to suboptimal visualization of the embolic material, the right uterine artery was further particle embolized with Gelfoam slurry.  Despite administering what was deemed an adequate volume of particle embolization material, there was persistent flow to this pseudoaneurysm with ultimate extravasation into the pelvis. As such, the micro catheter was advanced to the neck of the  pseudoaneurysm / area of extravasation and the distal aspect of the right uterine artery was coil embolized with multiple overlapping 4 and 3 mm diameter pushable coils. The micro catheter was withdrawn into the more proximal aspect of the right uterine artery and additional Gel-Foam slurry was administered for particle embolization. The micro catheter was removed and a repeat right internal iliac arteriogram was performed demonstrating complete occlusion of the  right internal iliac artery without persistent supply to the area of extravasation.  The C2 catheter was then utilized to select of the ipsilateral left internal iliac artery and a selective left internal iliac arteriogram was performed in various obliquities.  At this point, the procedure was terminated. At this point, all wires, catheters and sheaths were removed from the patient. Hemostasis was achieved at the left groin access site with deployment of an Exoseal closure device. The patient tolerated the procedure well without immediate post procedural complication.  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  A right internal iliac and sub selective right uterine arteriograms were performed demonstrating supply to this pseudoaneurysm via a spasmodic right uterine artery.  Despite sub selective particle embolization of the right uterine artery with Embospheres and Gel-Foam slurry, flow to the pseudoaneurysm persisted with ultimate frank extravasation into the pelvis. As such, the neck of the pseudoaneurysm / area of extravasation as well as the distal aspect of the right uterine artery were coil embolized with multiple overlapping pushable coils. The distal aspect of the right uterine artery was then further particle embolized with a small amount of additional Gel-Foam slurry.  Post right internal iliac arteriogram demonstrates complete occlusion of  the right uterine artery without persistent flow to the pseudoaneurysm / area of extravasation.  Sub selective left internal iliac arteriogram was performed in various obliquities however failed to delineate a definitive left-sided uterine artery. Additionally, there was no definitive supply to the pseudoaneurysm / area of extravasation within the midline of the pelvis. As such, a left-sided embolization was not performed.  IMPRESSION: 1. 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 2. Sub selective left internal iliac arteriogram failed to delineate a patent left-sided uterine artery or definitive supply to the pseudoaneurysm / area of extravasation and as such, a left-sided embolization was not performed. Critical Value/emergent results were discussed with Dr. Lavella Lemons PRATT at the completion of the procedure who verbally acknowledged these results.   Electronically Signed   By: Sandi Mariscal M.D.   On: 01/06/2014 08:35   Ir Angiogram Follow Up Study  01/06/2014   INDICATION: History of hysterectomy (12/17/2013), now with delayed post partum hemorrhage.  EXAM: 1. PELVIC ARTERIOGRAM 2. BILATERAL INTERNAL ILIAC ARTERIOGRAMS 3. SELECTIVE RIGHT UTERINE ARTERY ARTERIOGRAM AND PERCUTANEOUS COIL AND PARTICLE EMBOLIZATION 4. ULTRASOUND GUIDANCE FOR ARTERIAL ACCESS  MEDICATIONS: The patient is currently intubated and on Propofol and Fentanyl drips. Patient was administered an additional 4 mg of Versed a during the procedure.  ANESTHESIA/SEDATION: Sedation time  100 minutes  CONTRAST:  135m OMNIPAQUE IOHEXOL 300 MG/ML  SOLN  COMPARISON:  [place] dated 01/05/2014  FLUOROSCOPY TIME:  29 minutes.  24 seconds.  PROCEDURE: Informed consent was obtained from the patient's family following explanation of the procedure, risks, benefits and alternatives. The patient's family understands, agrees and consents for the  procedure. All questions were addressed. A time out was performed prior to the initiation of the procedure. Maximal barrier sterile technique utilized including caps, mask, sterile gowns, sterile gloves, large sterile drape, hand hygiene, and Betadine prep.  Given the presence of an existing right femoral central venous catheter, the decision was made to access the contralateral left common femoral artery. The left femoral head was marked fluoroscopically. Under sterile conditions and local anesthesia, the left common femoral artery access was performed with a micropuncture  needle. Under direct ultrasound guidance, the right common femoral was accessed with a micropuncture kit. An ultrasound image was saved for documentation purposes. This allowed for placement of a 5-French vascular sheath. A limited arteriogram was performed through the side arm of the sheath confirming appropriate access within the left common femoral artery.  An Omniflush catheter was advanced just above the aortic bifurcation a pelvic arteriogram was performed. A stiff glide wire was advanced into the contralateral right common femoral artery and under intermittent fluoroscopic guidance, the Omni flush catheter was exchanged for a C2 catheter.  The C2 catheter was utilized to select the contralateral right internal iliac artery. Selective right internal iliac angiogram was performed. 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 Selective catheterization was performed of the right uterine artery with a microcatheter and micro guide wire. 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  Given the spasmodic nature of the right uterine artery, the decision was initially made to perform particle embolization from this location. Particle embolization was initially performed with approximately 1/2 vial of 500 - 700 micron Embospheres, however secondary to  suboptimal visualization of the embolic material, the right uterine artery was further particle embolized with Gelfoam slurry.  Despite administering what was deemed an adequate volume of particle embolization material, there was persistent flow to this pseudoaneurysm with ultimate extravasation into the pelvis. As such, the micro catheter was advanced to the neck of the pseudoaneurysm / area of extravasation and the distal aspect of the right uterine artery was coil embolized with multiple overlapping 4 and 3 mm diameter pushable coils. The micro catheter was withdrawn into the more proximal aspect of the right uterine artery and additional Gel-Foam slurry was administered for particle embolization. The micro catheter was removed and a repeat right internal iliac arteriogram was performed demonstrating complete occlusion of the right internal iliac artery without persistent supply to the area of extravasation.  The C2 catheter was then utilized to select of the ipsilateral left internal iliac artery and a selective left internal iliac arteriogram was performed in various obliquities.  At this point, the procedure was terminated. At this point, all wires, catheters and sheaths were removed from the patient. Hemostasis was achieved at the left groin access site with deployment of an Exoseal closure device. The patient tolerated the procedure well without immediate post procedural complication.  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  A right internal iliac and sub selective right uterine arteriograms were performed demonstrating supply to this pseudoaneurysm via a spasmodic right uterine artery.  Despite sub selective particle embolization of the right uterine artery with Embospheres and Gel-Foam slurry, flow to the pseudoaneurysm persisted with ultimate frank extravasation into the pelvis. As such, the  neck of the pseudoaneurysm / area of extravasation as well as the distal aspect of the right uterine artery were coil embolized with multiple overlapping pushable coils. The distal aspect of the right uterine artery was then further particle embolized with a small amount of additional Gel-Foam slurry.  Post right internal iliac arteriogram demonstrates complete occlusion of the right uterine artery without persistent flow to the pseudoaneurysm / area of extravasation.  Sub selective left internal iliac arteriogram was performed in various obliquities however failed to delineate a definitive left-sided uterine artery. Additionally, there was no definitive supply to the pseudoaneurysm / area of extravasation within the midline  of the pelvis. As such, a left-sided embolization was not performed.  IMPRESSION: 1. 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 2. Sub selective left internal iliac arteriogram failed to delineate a patent left-sided uterine artery or definitive supply to the pseudoaneurysm / area of extravasation and as such, a left-sided embolization was not performed. Critical Value/emergent results were discussed with Dr. Lavella Lemons PRATT at the completion of the procedure who verbally acknowledged these results.   Electronically Signed   By: Sandi Mariscal M.D.   On: 01/06/2014 08:35   Ir US Guide Vasc Access Left  01/06/2014   INDICATION: History of hysterectomy (12/17/2013), now with delayed post partum hemorrhage.  EXAM: 1. PELVIC ARTERIOGRAM 2. BILATERAL INTERNAL ILIAC ARTERIOGRAMS 3. SELECTIVE RIGHT UTERINE ARTERY ARTERIOGRAM AND PERCUTANEOUS COIL AND PARTICLE EMBOLIZATION 4. ULTRASOUND GUIDANCE FOR ARTERIAL ACCESS  MEDICATIONS: The patient is currently intubated and on Propofol and Fentanyl drips. Patient was administered an additional 4 mg of Versed a during the procedure.  ANESTHESIA/SEDATION:  Sedation time  100 minutes  CONTRAST:  174m OMNIPAQUE IOHEXOL 300 MG/ML  SOLN  COMPARISON:  [place]PLE[place] dated 01/05/2014  FLUOROSCOPY TIME:  29 minutes.  24 seconds.  PROCEDURE: Informed consent was obtained from the patient's family following explanation of the procedure, risks, benefits and alternatives. The patient's family understands, agrees and consents for the procedure. All questions were addressed. A time out was performed prior to the initiation of the procedure. Maximal barrier sterile technique utilized including caps, mask, sterile gowns, sterile gloves, large sterile drape, hand hygiene, and Betadine prep.  Given the presence of an existing right femoral central venous catheter, the decision was made to access the contralateral left common femoral artery. The left femoral head was marked fluoroscopically. Under sterile conditions and local anesthesia, the left common femoral artery access was performed with a micropuncture needle. Under direct ultrasound guidance, the right common femoral was accessed with a micropuncture kit. An ultrasound image was saved for documentation purposes. This allowed for placement of a 5-French vascular sheath. A limited arteriogram was performed through the side arm of the sheath confirming appropriate access within the left common femoral artery.  An Omniflush catheter was advanced just above the aortic bifurcation a pelvic arteriogram was performed. A stiff glide wire was advanced into the contralateral right common femoral artery and under intermittent fluoroscopic guidance, the Omni flush catheter was exchanged for a C2 catheter.  The C2 catheter was utilized to select the contralateral right internal iliac artery. Selective right internal iliac angiogram was performed. 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 Selective catheterization was performed of the right uterine artery with a microcatheter and micro guide wire. A selective right uterine  angiogram was performed and demonstrated a large apparent pseudoaneurysm within the mid aspect of the pelvis.  Given the spasmodic nature of the right uterine artery, the decision was initially made to perform particle embolization from this location. Particle embolization was initially performed with approximately 1/2 vial of 500 - 700 micron Embospheres, however secondary to suboptimal visualization of the embolic material, the right uterine artery was further particle embolized with Gelfoam slurry.  Despite administering what was deemed an adequate volume of particle embolization material, there was persistent flow to this pseudoaneurysm with ultimate extravasation into the pelvis. As such, the micro catheter was advanced to the neck of the pseudoaneurysm / area of extravasation and the distal aspect of the right uterine artery was  coil embolized with multiple overlapping 4 and 3 mm diameter pushable coils. The micro catheter was withdrawn into the more proximal aspect of the right uterine artery and additional Gel-Foam slurry was administered for particle embolization. The micro catheter was removed and a repeat right internal iliac arteriogram was performed demonstrating complete occlusion of the right internal iliac artery without persistent supply to the area of extravasation.  The C2 catheter was then utilized to select of the ipsilateral left internal iliac artery and a selective left internal iliac arteriogram was performed in various obliquities.  At this point, the procedure was terminated. At this point, all wires, catheters and sheaths were removed from the patient. Hemostasis was achieved at the left groin access site with deployment of an Exoseal closure device. The patient tolerated the procedure well without immediate post procedural complication.  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   A right internal iliac and sub selective right uterine arteriograms were performed demonstrating supply to this pseudoaneurysm via a spasmodic right uterine artery.  Despite sub selective particle embolization of the right uterine artery with Embospheres and Gel-Foam slurry, flow to the pseudoaneurysm persisted with ultimate frank extravasation into the pelvis. As such, the neck of the pseudoaneurysm / area of extravasation as well as the distal aspect of the right uterine artery were coil embolized with multiple overlapping pushable coils. The distal aspect of the right uterine artery was then further particle embolized with a small amount of additional Gel-Foam slurry.  Post right internal iliac arteriogram demonstrates complete occlusion of the right uterine artery without persistent flow to the pseudoaneurysm / area of extravasation.  Sub selective left internal iliac arteriogram was performed in various obliquities however failed to delineate a definitive left-sided uterine artery. Additionally, there was no definitive supply to the pseudoaneurysm / area of extravasation within the midline of the pelvis. As such, a left-sided embolization was not performed.  IMPRESSION: 1. 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 2. Sub selective left internal iliac arteriogram failed to delineate a patent left-sided uterine artery or definitive supply to the pseudoaneurysm / area of extravasation and as such, a left-sided embolization was not performed. Critical Value/emergent results were discussed with Dr. Lavella Lemons PRATT at the completion of the procedure who verbally acknowledged these results.   Electronically Signed   By: Sandi Mariscal M.D.   On: 01/06/2014 08:35   Dg Chest Port 1 View  01/06/2014   CLINICAL DATA:  Intubated.  Shortness of breath.  EXAM: PORTABLE CHEST - 1 VIEW  COMPARISON:  01/05/2014.   FINDINGS: Endotracheal tube ends in the lower thoracic trachea, at least 2 cm above the carina. Orogastric tube enters the stomach. Low volume lungs with mild basilar atelectasis. No effusion or pneumothorax.  IMPRESSION: 1. Endotracheal and orogastric tubes are in good position. 2. Low volume lungs with mild atelectasis.   Electronically Signed   By: Jorje Guild M.D.   On: 01/06/2014 06:02   Dg Chest Port 1 View  01/05/2014   CLINICAL DATA:  Intubation.  EXAM: PORTABLE CHEST - 1 VIEW  COMPARISON:  Chest x-ray 01/05/2014.  FINDINGS: Endotracheal tube and NG tube in good anatomic position. Low lung volumes. No focal infiltrate. Heart size stable. No pleural effusion or pneumothorax. No acute osseous abnormality.  IMPRESSION: Stable tube positions.  Low lung volumes.  No acute abnormality .  Electronically Signed   By: Marcello Moores  Register   On: 01/05/2014 15:12   Ir Ileana Ladd Art  [place] [facility] [employer]  01/06/2014   INDICATION: History of hysterectomy (12/17/2013), now with delayed post partum hemorrhage.  EXAM: 1. PELVIC ARTERIOGRAM 2. BILATERAL INTERNAL ILIAC ARTERIOGRAMS 3. SELECTIVE RIGHT UTERINE ARTERY ARTERIOGRAM AND PERCUTANEOUS COIL AND PARTICLE EMBOLIZATION 4. ULTRASOUND GUIDANCE FOR ARTERIAL ACCESS  MEDICATIONS: The patient is currently intubated and on Propofol and Fentanyl drips. Patient was administered an additional 4 mg of Versed a during the procedure.  ANESTHESIA/SEDATION: Sedation time  100 minutes  CONTRAST:  133mL OMNIPAQUE IOHEXOL 300 MG/ML  SOLN  COMPARISON:  US PELVIS COMPLETE dated 01/05/2014  FLUOROSCOPY TIME:  29 minutes.  24 seconds.  PROCEDURE: Informed consent was obtained from the patient's family following explanation of the procedure, risks, benefits and alternatives. The patient's family understands, agrees and consents for the procedure. All questions were addressed. A time out was performed prior to the initiation of the procedure. Maximal barrier sterile  technique utilized including caps, mask, sterile gowns, sterile gloves, large sterile drape, hand hygiene, and Betadine prep.  Given the presence of an existing right femoral central venous catheter, the decision was made to access the contralateral left common femoral artery. The left femoral head was marked fluoroscopically. Under sterile conditions and local anesthesia, the left common femoral artery access was performed with a micropuncture needle. Under direct ultrasound guidance, the right common femoral was accessed with a micropuncture kit. An ultrasound image was saved for documentation purposes. This allowed for placement of a 5-French vascular sheath. A limited arteriogram was performed through the side arm of the sheath confirming appropriate access within the left common femoral artery.  An Omniflush catheter was advanced just above the aortic bifurcation a pelvic arteriogram was performed. A stiff glide wire was advanced into the contralateral right common femoral artery and under intermittent fluoroscopic guidance, the Omni flush catheter was exchanged for a C2 catheter.  The C2 catheter was utilized to select the contralateral right internal iliac artery. Selective right internal iliac angiogram was performed. 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 Selective catheterization was performed of the right uterine artery with a microcatheter and micro guide wire. 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  Given the spasmodic nature of the right uterine artery, the decision was initially made to perform particle embolization from this location. Particle embolization was initially performed with approximately 1/2 vial of 500 - 700 micron Embospheres, however secondary to suboptimal visualization of the embolic material, the right uterine artery was further particle embolized with Gelfoam slurry.  Despite  administering what was deemed an adequate volume of particle embolization material, there was persistent flow to this pseudoaneurysm with ultimate extravasation into the pelvis. As such, the micro catheter was advanced to the neck of the pseudoaneurysm / area of extravasation and the distal aspect of the right uterine artery was coil embolized with multiple overlapping 4 and 3 mm diameter pushable coils. The micro catheter was withdrawn into the more proximal aspect of the right uterine artery and additional Gel-Foam slurry was administered for particle embolization. The micro catheter was removed and a repeat right internal iliac arteriogram was performed demonstrating complete occlusion of the right internal iliac artery without persistent supply to the area of extravasation.  The C2 catheter was then utilized to select of the ipsilateral left internal iliac artery and  a selective left internal iliac arteriogram was performed in various obliquities.  At this point, the procedure was terminated. At this point, all wires, catheters and sheaths were removed from the patient. Hemostasis was achieved at the left groin access site with deployment of an Exoseal closure device. The patient tolerated the procedure well without immediate post procedural complication.  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  A right internal iliac and sub selective right uterine arteriograms were performed demonstrating supply to this pseudoaneurysm via a spasmodic right uterine artery.  Despite sub selective particle embolization of the right uterine artery with Embospheres and Gel-Foam slurry, flow to the pseudoaneurysm persisted with ultimate frank extravasation into the pelvis. As such, the neck of the pseudoaneurysm / area of extravasation as well as the distal aspect of the right uterine artery were coil embolized with  multiple overlapping pushable coils. The distal aspect of the right uterine artery was then further particle embolized with a small amount of additional Gel-Foam slurry.  Post right internal iliac arteriogram demonstrates complete occlusion of the right uterine artery without persistent flow to the pseudoaneurysm / area of extravasation.  Sub selective left internal iliac arteriogram was performed in various obliquities however failed to delineate a definitive left-sided uterine artery. Additionally, there was no definitive supply to the pseudoaneurysm / area of extravasation within the midline of the pelvis. As such, a left-sided embolization was not performed.  IMPRESSION: 1. 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 2. Sub selective left internal iliac arteriogram failed to delineate a patent left-sided uterine artery or definitive supply to the pseudoaneurysm / area of extravasation and as such, a left-sided embolization was not performed. Critical Value/emergent results were discussed with Dr. Lavella Lemons PRATT at the completion of the procedure who verbally acknowledged these results.   Electronically Signed   By: Sandi Mariscal M.D.   On: 01/06/2014 08:35    Anti-infectives: Anti-infectives   None      Assessment/Plan: s/p uterine hemorrhage with successful right sided uterine artery coiling, gel foam and embosphere embolization for active uterine extravasation 1/13; currently stable; plans as per CCM/OBGYN  LOS: 2 days     Jandel Patriarca,D [facility] 01/07/2014

## 2014-01-07 NOTE — Progress Notes (Signed)
Please provide patient with lactation consultant number on discharge for follow up, [phone]

## 2014-01-08 LAB — CBC
HCT: 24.3 % — ABNORMAL LOW (ref 36.0–46.0)
Hemoglobin: 8.4 g/dL — ABNORMAL LOW (ref 12.0–15.0)
MCH: 29 pg (ref 26.0–34.0)
MCHC: 34.6 g/dL (ref 30.0–36.0)
MCV: 83.8 fL (ref 78.0–100.0)
Platelets: 226 10*3/uL (ref 150–400)
RBC: 2.9 MIL/uL — AB (ref 3.87–5.11)
RDW: 15.6 % — ABNORMAL HIGH (ref 11.5–15.5)
WBC: 11.7 10*3/uL — ABNORMAL HIGH (ref 4.0–10.5)

## 2014-01-08 MED ORDER — DOCUSATE SODIUM 100 MG PO CAPS
100.0000 mg | ORAL_CAPSULE | Freq: Two times a day (BID) | ORAL | Status: DC | PRN
Start: 2014-01-08 — End: 2014-01-09
  Administered 2014-01-08: 18:00:00 100 mg via ORAL
  Filled 2014-01-08 (×2): qty 1

## 2014-01-08 MED ORDER — FERROUS SULFATE 325 (65 FE) MG PO TABS
325.0000 mg | ORAL_TABLET | Freq: Three times a day (TID) | ORAL | Status: DC
  Administered 2014-01-08 – 2014-01-09 (×3): 325 mg via ORAL
  Filled 2014-01-08 (×5): qty 1

## 2014-01-08 NOTE — Progress Notes (Signed)
Patient ID: Erika Wright, female   DOB: 1991-01-17, 23 y.o.   MRN: [id number]  OB/GYN Progress Note  Postop day 22.s/p cesarean, pod 3 s/p D&C, s/p ex lap, repair posterior uterine perforation and [place] by IR.  Subjective:   Pt transferred to regular floor bed today. Doing well. Has been up and moving around some.  Some mild RUQ discomfort but improving daily and controlled with pain medicine. Some light pink bleeding but very minor and no clots or bright red blood.  Pt has been pumping breast milk and tolerating a regular diet.   No fevers, chills, nausea, vomiting, constipation, diarrhea.     Objective: Filed Vitals:   01/08/14 1149 01/08/14 1205 01/08/14 1300 01/08/14 1515  BP:  128/65  144/85  Pulse:  89 71 84  Temp: 99.5 F (37.5 C)   99.7 F (37.6 C)  TempSrc: Oral   Oral  Resp:  23 26 20   Height:      Weight:      SpO2:  100% 100% 99%   I/O last 3 completed shifts: In: 1131.7 [P.O.:240; I.V.:540; Blood:351.7] Out: 3225 [Urine:3225] Total I/O In: -  Out: 650 [Urine:650]   IGeneral: alert, cooperative, appears stated age and no distress Resp: clear to auscultation bilaterally Cardio: regular rate and rhythm, S1, S2 normal, no murmur, click, rub or gallop GI: soft, non-tender; bowel sounds normal; no masses,  no organomegaly and incision: clean, dry and dressing in place and elevated. Incision dry with staples in place.  Extremities: extremities normal, atraumatic, no cyanosis or edema and Homans sign is negative, no sign of DVT Vaginal Bleeding: minimal  Assessment:   S/p Ex Lap ([place] ) for late postsurgical hemorrhage, s/p multiple transfusions, at least 12 u prbc, plus plts, FFP, cryo,  Now s/p IR embolization of Rt uterine artery. Day 3 post op.  Stable retroperitoneal hematoma.     stable, progressing well, tolerating diet and anemia  Plan: - Hgb stable at 8.4 post transfusion and this AM remains stable. Hold on further blood products.  - repeat CBC  only if symptomatic.  - cont pain control as currently ordered - routine diet - cont breast pumping with pump provided  -if remains stable, will likely d/c tomorrow to home.   - will need appointment on Monday with home OB/GYN for f/u and staple removal.     LOS: 3 days    Vale HavenBECK, Troy Hartzog L, MD 01/08/2014, 3:53 PM OB Fellow

## 2014-01-08 NOTE — Progress Notes (Signed)
PULMONARY / CRITICAL CARE MEDICINE  Name: Delailah Spieth MRN: [id number] DOB: Oct 29, 1991    ADMISSION DATE:  01/05/2014 CONSULTATION DATE:  01/05/2014  REFERRING MD :  Dr. Manfred Shirts  PRIMARY SERVICE: Pulmonary/Critical Care  CHIEF COMPLAINT:  Bleeding/Unresponsiveness  BRIEF PATIENT DESCRIPTION: Obese 23y.o. [place] female G3P3 only history of "leaky heart valve" delivered by c-section 12/17/13 crit 24.4 returned 1/12 with hemorrhage s/p 2 D and C at [facility], IR embolization at [facility] with DIC and shock.  SIGNIFICANT EVENTS / STUDIES:  1/12  To ED, uncontrolled vaginal bleeding, D&C x 2, repair uterine tearintubated, cortis placed 1/13  Continued bleeding, tx to [facility] 8 units PRBC, FFP 6 Plat 2 1 cryo 1/13  IR embolization of uterine vessels without ability to find vessel in hemorrhagic region  LINES / TUBES: R femoral cortis 1/12 >>> 1/15 Foley 1/12 >>>1/15 ETT 1/12 >>>1/14 NGT 1/12 >>>1/14  CULTURES: 1/13  Urine >>>  ANTIBIOTICS: Clindamycin 1/12 >>> 1/13 Ampicillin 1/12 >>> 1/13 Zosyn 1/13 >>> 1/14   SUBJECTIVE: Doing well, slept decently. No pain at this time. Abdomen non-tender.   VITAL SIGNS: Temp:  [98.3 F (36.8 C)-100.1 F (37.8 C)] 98.9 F (37.2 C) (01/16 0405) Pulse Rate:  [86-127] 90 (01/16 0700) Resp:  [12-37] 32 (01/16 0700) BP: (128-144)/(69-96) 130/77 mmHg (01/16 0700) SpO2:  [95 %-100 %] 100 % (01/16 0700) Weight:  [218 lb 4.1 oz (99 kg)] 218 lb 4.1 oz (99 kg) (01/16 0405)  HEMODYNAMICS:   VENTILATOR SETTINGS:    INTAKE / OUTPUT: Intake/Output     01/15 0701 - 01/16 0700 01/16 0701 - 01/17 0700   P.O.     I.V. (mL/kg) 200 (2)    Blood     IV Piggyback     Total Intake(mL/kg) 200 (2)    Urine (mL/kg/hr) 2200 (0.9)    Total Output 2200     Net -2000            PHYSICAL EXAMINATION: General:  Obese, sleepy, pleasant Neuro:   follows commands, answers questions appropriately HEENT:  PERRLA Cardiovascular:  Regular rate, no murmur  appreciated Lungs:  Mostly clear bilaterally, good air movement, no increased WOB Abdomen:  Distended, hypoactive bowel sounds, minimal to moderate tender Musculoskeletal:  Moves all extremities Skin:  Intact, C/S site with intact dry/clean dressing  LABS:  CBC  Recent Labs Lab 01/07/14 0445 01/07/14 1813 01/08/14 0527  WBC 13.0* 14.6* 11.7*  HGB 7.5* 8.4* 8.4*  HCT 21.3* 24.6* 24.3*  PLT 167 215 226   Coag's  Recent Labs Lab 01/05/14 1333 01/06/14 0630  APTT 29 30  INR 1.42  --    BMET  Recent Labs Lab 01/05/14 1333 01/06/14 0530 01/07/14 0445  NA 137 141 141  K 4.4 4.2 3.9  CL 109 113* 111  CO2 18* 17* 19  BUN 9 10 7   CREATININE 1.06 1.13* 0.86  GLUCOSE 132* 109* 84   Electrolytes  Recent Labs Lab 01/05/14 1333 01/06/14 0530 01/07/14 0445  CALCIUM 6.2* 6.9* 7.6*  MG 3.2*  --  2.0  PHOS 3.9  --  2.9   Sepsis Markers  Recent Labs Lab 01/05/14 1334  LATICACIDVEN 1.6   ABG  Recent Labs Lab 01/06/14 0100 01/06/14 0401 01/07/14 0410  PHART 7.362 7.369 7.394  PCO2ART 32.6* 29.7* 32.3*  PO2ART 137.0* 153.0* 71.1*   Liver Enzymes  Recent Labs Lab 01/05/14 1333  AST 21  ALT 13  ALKPHOS 43  BILITOT 0.6  ALBUMIN 1.7*  Cardiac Enzymes No results found for this basename: TROPONINI, PROBNP,  in the last 168 hours Glucose  Recent Labs Lab 01/05/14 1533 01/05/14 1904 01/06/14 0007 01/06/14 0358 01/06/14 0750 01/06/14 1205  GLUCAP 112* 125* 119* 124* 109* 103*   CXR:  No new  ASSESSMENT / PLAN:  PULMONARY A:  Acute respiratory failure in setting of hemorrhagic shock. Resolved. P:   SpO2>92 Supplemental oxygen PRN  CARDIOVASCULAR A:  Hemorrhagic shock, resolved. Tachycardia, resolved. "Leaky heart valve" P:  Goal MAP > 60  RENAL A:   AKI, resolved. Mild metabolic acidosis. P:   Trend BMP  GASTROINTESTINAL A:   GI Px is not indicated. Nutrition P:   Diet as tolerated  HEMATOLOGIC A:   Anemia of acute  blood loss. Uterine hemorrhage. DVT Px. P:  OB/GYN following Goal Hg 7 CBC to daily SCDs  INFECTIOUS A:   Suspected endometritis initially. P:   Per OB  ENDOCRINE A:   No active issues. P:   No intervention required  NEUROLOGIC A:   Acute encephalopathy, resolved. Post op pain. P:   Oxy IR prn for pain  01/08/2014, 7:31 AM [name], [name]  Transfer to medical floor.  OB will follow.  PCCM will sign off.  TRH to assume care 1/17.  I have personally obtained history, examined patient, evaluated and interpreted laboratory and imaging results, reviewed medical records, formulated assessment / plan and placed orders.  Lonia FarberZUBELEVITSKIY, Narya Beavin, MD Pulmonary and Critical Care Medicine [facility] Pager: [phone]  01/08/2014, 10:15 AM

## 2014-01-08 NOTE — Progress Notes (Addendum)
NURSING PROGRESS NOTE  Erika Wright [id number] Transfer Data: 01/08/2014 5:23 PM Attending Provider: Tereso NewcomerUgonna A Anyanwu, MD PCP:No PCP Per Patient Code Status:Full  Erika Wright is a 23 y.o. female patient transferred from 2mw. -No acute distress noted.  -No complaints of shortness of breath.  -No complaints of chest pain.   Blood pressure 144/85, pulse 84, temperature 99.7 F (37.6 C), temperature source Oral, resp. rate 20, height 5\' 3"  (1.6 m), weight 99 kg (218 lb 4.1 oz), SpO2 99.00%.   IV Fluids:  IV in place, occlusive dsg intact without redness, Located to R ac 01/05/14.  Allergies:  Review of patient's allergies indicates no known allergies.  Past Medical History:   has a past medical history of Heart valve problem.  Past Surgical History:   has past surgical history that includes Cholecystectomy; Cesarean section; and Exploratory laparotomy (2015).  Social History:   reports that she has never smoked. She does not have any smokeless tobacco history on file. She reports that she does not drink alcohol.  Skin: Intact. Dressing to abdomin intact, no sign of drainage at this time, clean and neat. Left bandage to groin also intact and clean.  Patient/Family orientated to room. Information packet given to patient/family. Admission inpatient armband information verified with patient/family to include name and date of birth and placed on patient arm. Side rails up x 2, fall assessment and education completed with patient/family. Patient/family able to verbalize understanding of risk associated with falls and verbalized understanding to call for assistance before getting out of bed. Call light within reach. Patient/family able to voice and demonstrate understanding of unit orientation instructions.    Will continue to evaluate and treat per MD orders.

## 2014-01-09 MED ORDER — IBUPROFEN 200 MG PO TABS: 600.0000 mg | ORAL_TABLET | Freq: Four times a day (QID) | ORAL | Status: AC | PRN

## 2014-01-09 MED ORDER — OXYCODONE HCL 5 MG PO TABS: 5.0000 mg | ORAL_TABLET | ORAL | Status: DC | PRN

## 2014-01-09 MED ORDER — FERROUS SULFATE 325 (65 FE) MG PO TABS: 325.0000 mg | ORAL_TABLET | Freq: Three times a day (TID) | ORAL | Status: AC

## 2014-01-09 MED ORDER — DSS 100 MG PO CAPS: 100.0000 mg | ORAL_CAPSULE | Freq: Two times a day (BID) | ORAL | Status: AC | PRN

## 2014-01-09 NOTE — Progress Notes (Signed)
Erika MayhewShanekia Wright to be D/C'd Home per MD order.  Discussed with the patient and all questions fully answered.    Medication List         DSS 100 MG Caps  Take 100 mg by mouth 2 (two) times daily as needed for mild constipation.     ferrous sulfate 325 (65 FE) MG tablet  Take 1 tablet (325 mg total) by mouth 3 (three) times daily with meals.     ibuprofen 200 MG tablet  Commonly known as:  ADVIL,MOTRIN  Take 3 tablets (600 mg total) by mouth every 6 (six) hours as needed for moderate pain.     oxyCODONE 5 MG immediate release tablet  Commonly known as:  Oxy IR/ROXICODONE  Take 1 tablet (5 mg total) by mouth every 4 (four) hours as needed for severe pain.        VVS, Skin clean, dry and intact without evidence of skin break down, no evidence of skin tears noted. IV catheter discontinued intact. Site without signs and symptoms of complications. Dressing and pressure applied.  An After Visit Summary was printed and given to the patient. Follow up appointments , new prescriptions and medication administration times given. Handout on ceasarian section given and discussed Patient escorted via WC, and D/C home via private auto.  Cindra EvesCelano, Cristle Jared Czarnecki, RN 01/09/2014 4:16 PM

## 2014-01-09 NOTE — Discharge Instructions (Signed)
Cesarean Delivery °Care After °Refer to this sheet in the next few weeks. These instructions provide you with information on caring for yourself after your procedure. Your health care provider may also give you specific instructions. Your treatment has been planned according to current medical practices, but problems sometimes occur. Call your health care provider if you have any problems or questions after you go home. °HOME CARE INSTRUCTIONS  °· Only take over-the-counter or prescription medications as directed by your health care provider. °· Do not drink alcohol, especially if you are breastfeeding or taking medication to relieve pain. °· Do not chew or smoke tobacco. °· Continue to use good perineal care. Good perineal care includes: °· Wiping your perineum from front to back. °· Keeping your perineum clean. °· Check your surgical cut (incision) daily for increased redness, drainage, swelling, or separation of skin. °· Clean your incision gently with soap and water every day, and then pat it dry. If your health care provider says it is OK, leave the incision uncovered. Use a bandage (dressing) if the incision is draining fluid or appears irritated. If the adhesive strips across the incision do not fall off within 7 days, carefully peel them off. °· Hug a pillow when coughing or sneezing until your incision is healed. This helps to relieve pain. °· Do not use tampons or douche until your health care provider says it is okay. °· Shower, wash your hair, and take tub baths as directed by your health care provider. °· Wear a well-fitting bra that provides breast support. °· Limit wearing support panties or control-top hose. °· Drink enough fluids to keep your urine clear or pale yellow. °· Eat high-fiber foods such as whole grain cereals and breads, brown rice, beans, and fresh fruits and vegetables every day. These foods may help prevent or relieve constipation. °· Resume activities such as climbing stairs,  driving, lifting, exercising, or traveling as directed by your health care provider. °· Talk to your health care provider about resuming sexual activities. This is dependent upon your risk of infection, your rate of healing, and your comfort and desire to resume sexual activity. °· Try to have someone help you with your household activities and your newborn for at least a few days after you leave the hospital. °· Rest as much as possible. Try to rest or take a nap when your newborn is sleeping. °· Increase your activities gradually. °· Keep all of your scheduled postpartum appointments. It is very important to keep your scheduled follow-up appointments. At these appointments, your health care provider will be checking to make sure that you are healing physically and emotionally. °SEEK MEDICAL CARE IF:  °· You are passing large clots from your vagina. Save any clots to show your health care provider. °· You have a foul smelling discharge from your vagina. °· You have trouble urinating. °· You are urinating frequently. °· You have pain when you urinate. °· You have a change in your bowel movements. °· You have increasing redness, pain, or swelling near your incision. °· You have pus draining from your incision. °· Your incision is separating. °· You have painful, hard, or reddened breasts. °· You have a severe headache. °· You have blurred vision or see spots. °· You feel sad or depressed. °· You have thoughts of hurting yourself or your newborn. °· You have questions about your care, the care of your newborn, or medications. °· You are dizzy or lightheaded. °· You have a rash. °· You   have pain, redness, or swelling at the site of the removed intravenous access (IV) tube. °· You have nausea or vomiting. °· You stopped breastfeeding and have not had a menstrual period within 12 weeks of stopping. °· You are not breastfeeding and have not had a menstrual period within 12 weeks of delivery. °· You have a fever. °SEEK  IMMEDIATE MEDICAL CARE IF: °· You have persistent pain. °· You have chest pain. °· You have shortness of breath. °· You faint. °· You have leg pain. °· You have stomach pain. °· Your vaginal bleeding saturates 2 or more sanitary pads in 1 hour. °MAKE SURE YOU:  °· Understand these instructions. °· Will watch your condition. °· Will get help right away if you are not doing well or get worse. °Document Released: 09/01/2002 Document Revised: 08/12/2013 Document Reviewed: 08/06/2012 °ExitCare® Patient Information ©2014 [employer]. ° ° ° °

## 2014-01-09 NOTE — Discharge Summary (Signed)
Attestation of Attending Supervision of Obstetric Fellow: Evaluation and management procedures were performed by the Obstetric Fellow under my supervision and collaboration.  I have reviewed the Obstetric Fellow's note and chart, and I agree with the management and plan.  Hansen Carino, MD, FACOG Attending Obstetrician & Gynecologist Faculty Practice, [facility]   

## 2014-01-09 NOTE — Discharge Summary (Signed)
Physician Discharge Summary  Patient ID: Erika Wright MRN: [id number] DOB/AGE: 17-Sep-1991 22 y.o.  Admit date: 01/05/2014 Discharge date: 01/09/2014  Admission Diagnoses: hemorrhagic shock, retroperitoneal bleed, delayed postpartum hemorrhage.   Discharge Diagnoses:  Principal Problem:   Hemorrhagic shock Active Problems:   Retroperitoneal bleed   Hemorrhage, delayed postpartum   Acute respiratory failure   Discharged Condition: fair  Hospital Course: Please see H&P for complete details of admission. Briefly, Erika Wright is a 23 y.o. G3P3 who was 19 days s/p RLTCS at [facility] for elective repeat. She had an intial hgb drop immediately pp and required 2 u PRBC's after surgery. She was doing well at home until 2 days prior to admission, when she developed acute onset bright red vaginal bleeding. She was returned to [facility] and given tocolytics and after failing to stop bleeding, was taken to the OR and underwent D & C with Bakri balloon placement. She received some blood products, but bleeding persisted and she was then taken to OR for possible hysterectomy. At that time, there was no hemoperitoneum, but continued vaginal bleeding. Noted to have hole in posterior uterus that was closed closed. Small vaginal laceration noted and packing placed. There was also found a large, calcified hematoma retroperitoneally, likely from initial surgery. It was felt that surgery (hysterectomy) should not be undertaken with this finding, and inability to secure blood supply and identify ureter. She was watched in [facility] for another few hours and received a total of 9 u PRBC's, some FFP and platelets there. Her bleeding continued and attempts to transfer patient to [facility] or Erika Wright were not successful due to unavailability of ICU beds. She was then transferred here.   Upon arrival, she had successfully been weaned off of pressors but required an additional 2U of PRBCs, platelets and FFP and was noted to be  continually bleeding. She was taken to interventional radiology on 01/06/14 for a right umbilical artery embolization which was performed successfully.  Her vaginally bleeding slowed after the procedure but did require a total of 2 more units of pRBCs.  She was monitored and slowly began to improve.  She was extubated on 01/07/14 and transferred out of the ICU on 01/08/14. Her hgb stabilized at 8.4.  Her vitals and UOP stabilizied. On 1/17, it was felt she was stable for discharge to home. She will f/u with [facility] on Monday for f/u and staple removal.    Consults: pulmonary/intensive care and interventional radiology  Significant Diagnostic Studies: Pelvic arteriogram with selective right uterine artery embolization.    Treatments: In total, 13u PRBCs, some FFP and platelets.    Discharge Exam: Blood pressure 138/79, pulse 100, temperature 98.8 F (37.1 C), temperature source Oral, resp. rate 20, height 5\' 3"  (1.6 m), weight 99 kg (218 lb 4.1 oz), SpO2 99.00%. General appearance: alert, cooperative and no distress Resp: clear to auscultation bilaterally Cardio: regular rate and rhythm GI: soft, non-tender; bowel sounds normal; no masses,  no organomegaly Pelvic: deferred and vaginal bleeding scant on pad Extremities: extremities normal, atraumatic, no cyanosis or edema, no edema, redness or tenderness in the calves or thighs and no ulcers, gangrene or trophic changes Pulses: 2+ and symmetric Skin: Skin color, texture, turgor normal. No rashes or lesions Incision/Wound: c/d/intact.  Staples in place. No erythema or edema.   Disposition: Final discharge disposition not confirmed  Discharge Orders   Future Orders Complete By Expires   Call MD for:  difficulty breathing, headache or visual disturbances  As directed  Call MD for:  extreme fatigue  As directed    Call MD for:  persistant dizziness or light-headedness  As directed    Call MD for:  persistant nausea and vomiting  As  directed    Call MD for:  redness, tenderness, or signs of infection (pain, swelling, redness, odor or green/yellow discharge around incision site)  As directed    Call MD for:  severe uncontrolled pain  As directed    Call MD for:  temperature >100.4  As directed    Diet - low sodium heart healthy  As directed    Discharge instructions  As directed    Comments:     Nothing in your vagina for at least 6 weeks. Please continue to not lift anything heavy until cleared by your doctor.   Increase activity slowly  As directed    No dressing needed  As directed        Medication List         DSS 100 MG Caps  Take 100 mg by mouth 2 (two) times daily as needed for mild constipation.     ferrous sulfate 325 (65 FE) MG tablet  Take 1 tablet (325 mg total) by mouth 3 (three) times daily with meals.     ibuprofen 200 MG tablet  Commonly known as:  ADVIL,MOTRIN  Take 3 tablets (600 mg total) by mouth every 6 (six) hours as needed for moderate pain.     oxyCODONE 5 MG immediate release tablet  Commonly known as:  Oxy IR/ROXICODONE  Take 1 tablet (5 mg total) by mouth every 4 (four) hours as needed for severe pain.           Follow-up Information   [facility] Schedule an appointment as soon as possible for a visit in 3 days.      Signed: Vale HavenBECK, Dacoda Finlay L MD 01/09/2014, 2:06 PM OB fellow

## 2014-01-10 LAB — CULTURE, BLOOD (SINGLE)

## 2014-01-18 LAB — PATHOLOGY REPORT

## 2014-10-25 ENCOUNTER — Encounter (HOSPITAL_COMMUNITY): Payer: Self-pay | Admitting: *Deleted

## 2015-03-08 IMAGING — US ABDOMEN ULTRASOUND LIMITED
1 series · 14 of 25 positions shown · non-contrast
Comparison: none

REASON FOR EXAM: EPIGASTRIC, RUQ PAIN
COMMENTS:   Body Site: GB and Fossa, CBD, Head of Pancreas

PROCEDURE:     US  - US ABDOMEN LIMITED SURVEY  - March 24, 2013  [DATE]
RESULT:     Comparison: None
TECHNIQUE: Multiple gray-scale and color-flow Doppler images of the right
upper quadrant are presented for review.

[Series 1: abdomen ultrasound limited · 0.25mm/px · 14 of 46 slices shown]
[im 1/46]
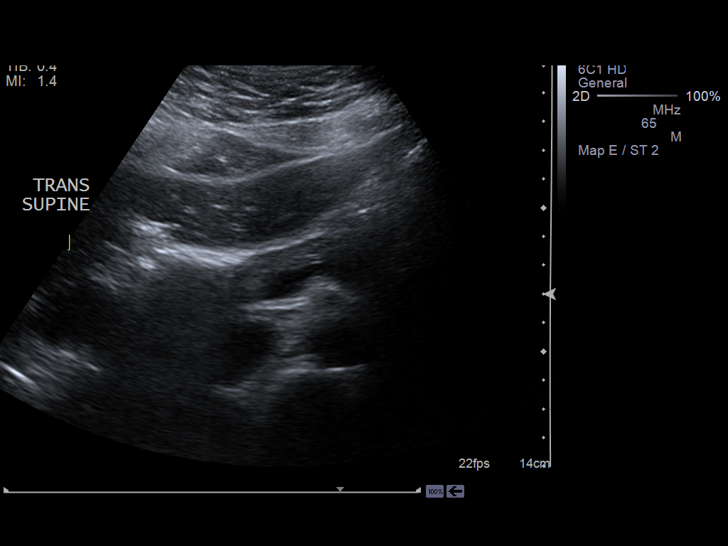
[im 4/46]
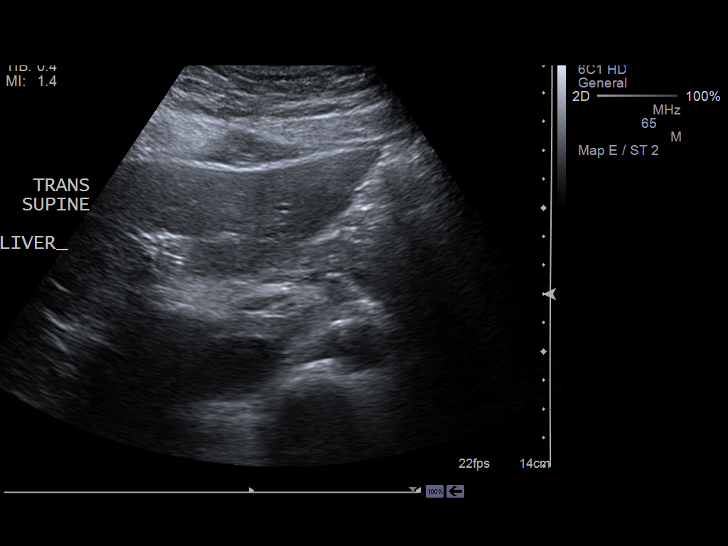
[im 8/46]
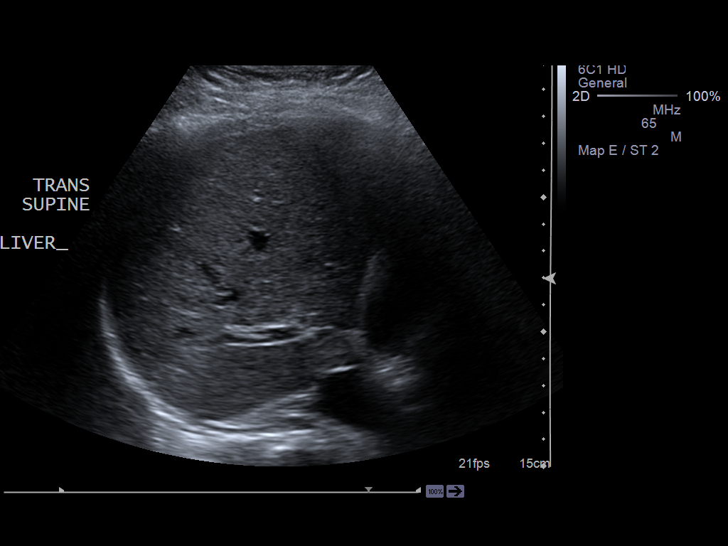
[im 12/46]
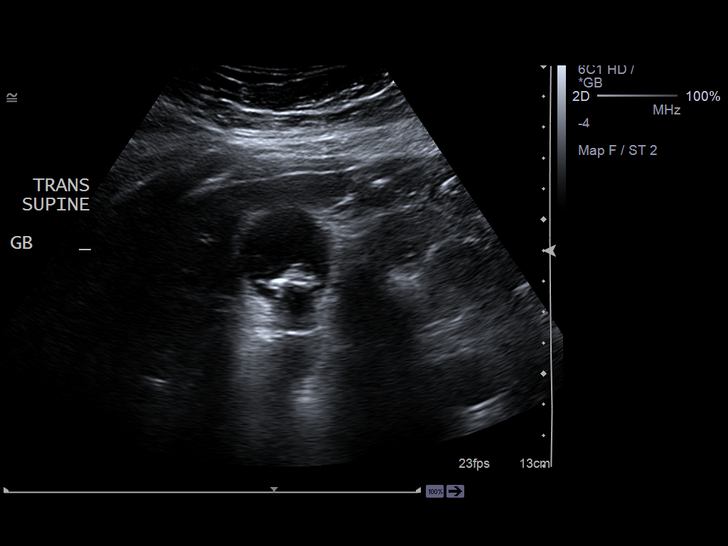
[im 16/46]
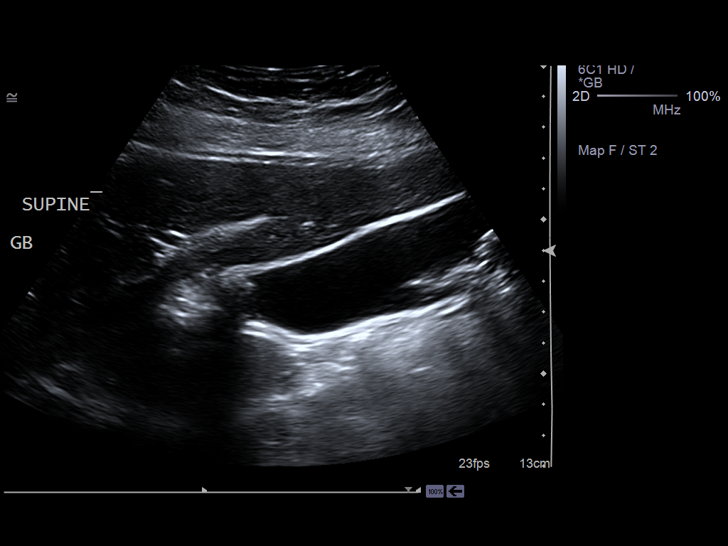
[im 17/46]
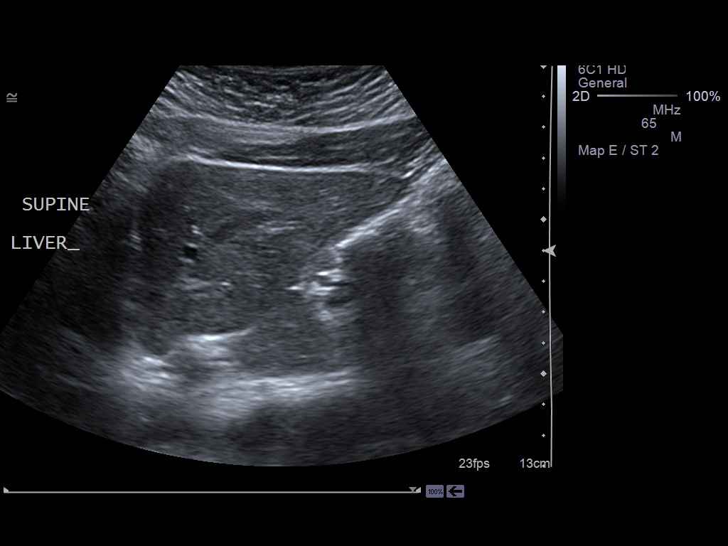
[im 21/46]
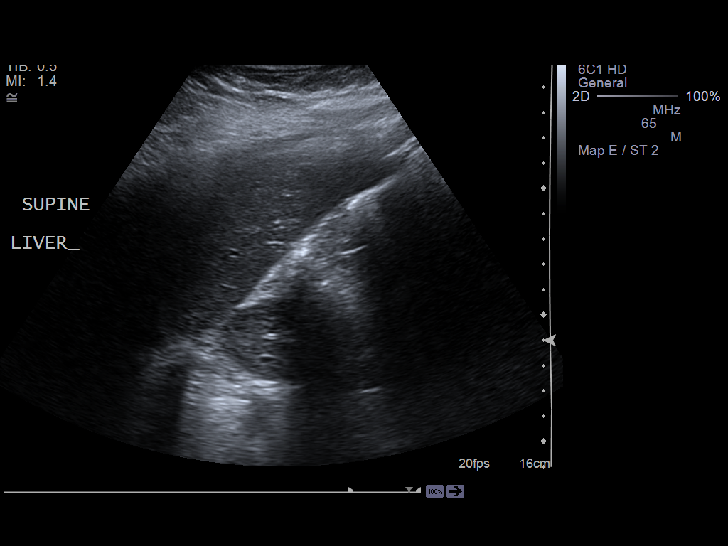
[im 25/46]
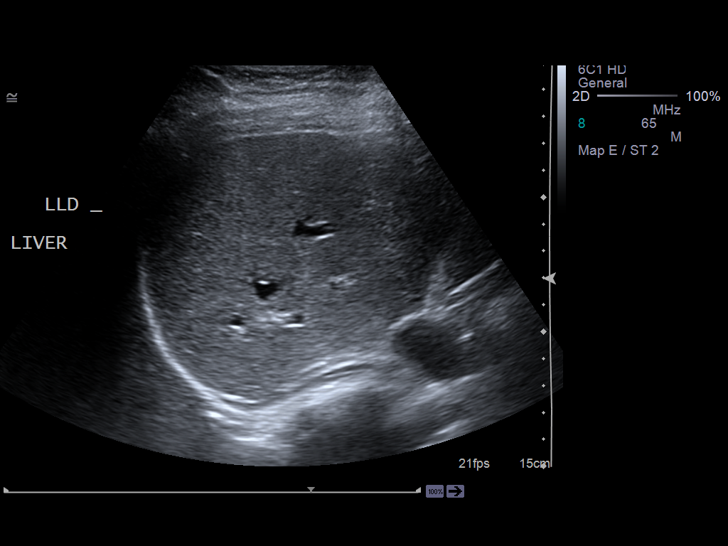
[im 29/46]
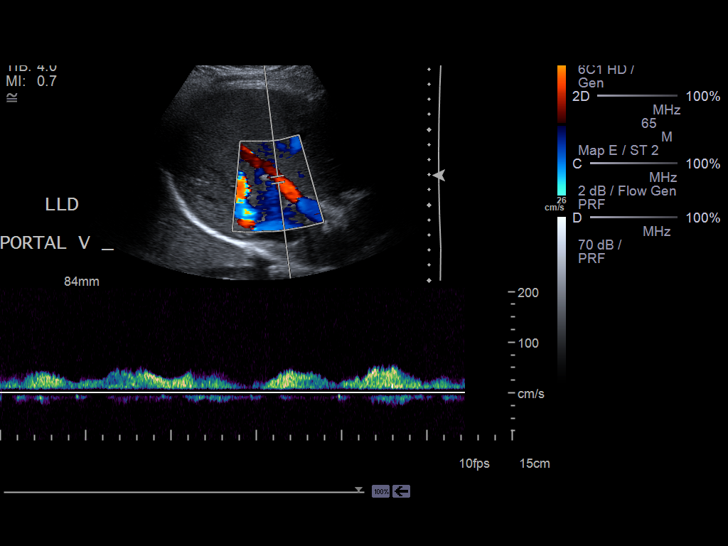
[im 31/46]
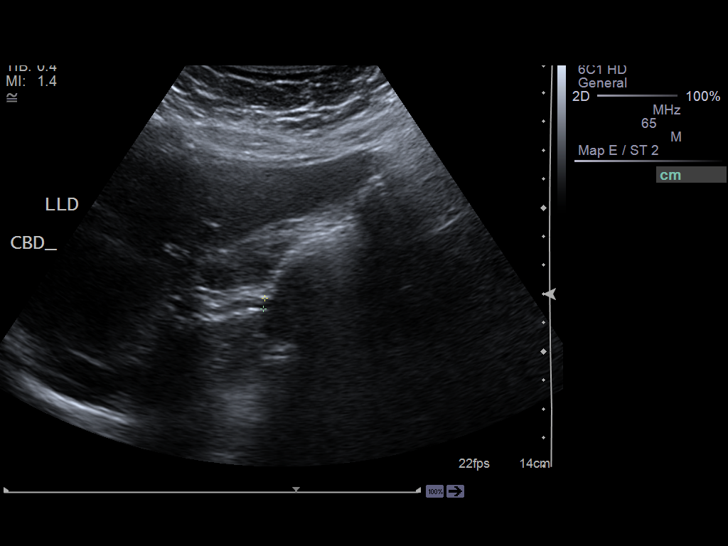
[im 34/46]
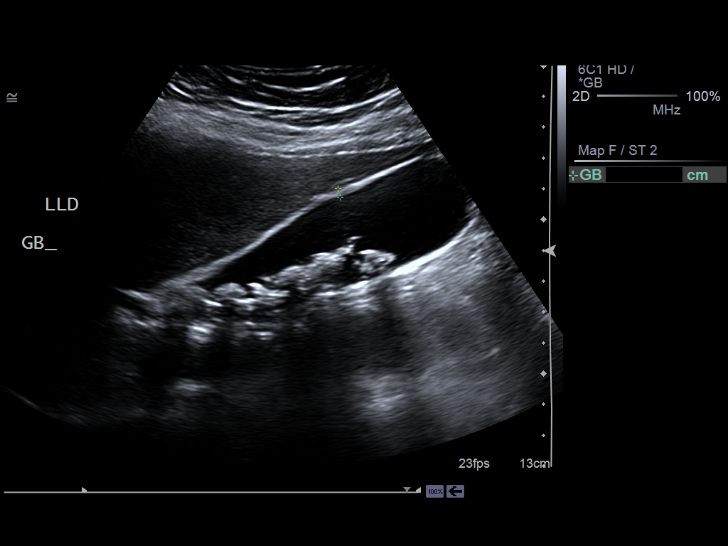
[im 38/46]
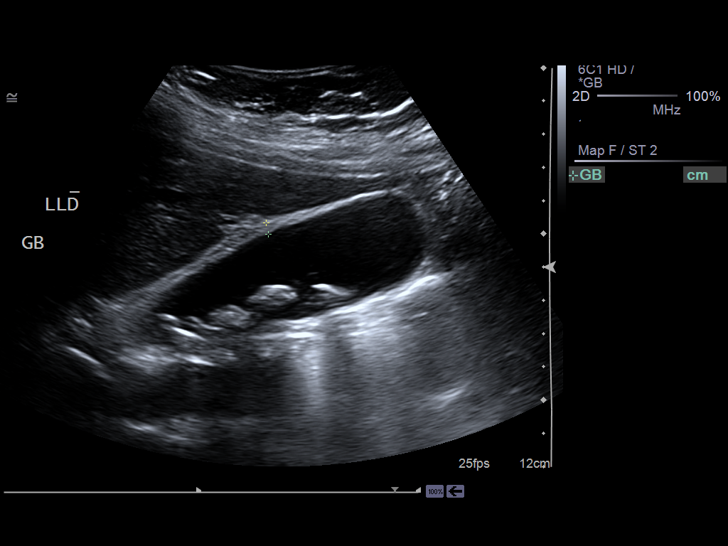
[im 42/46]
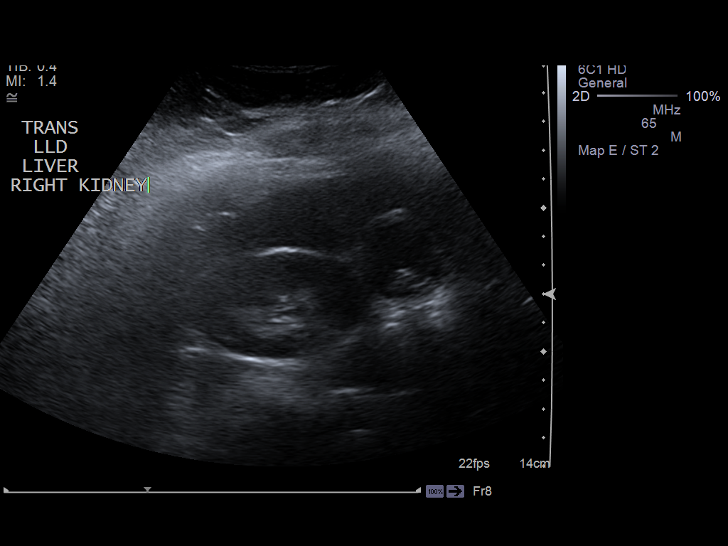
[im 46/46]
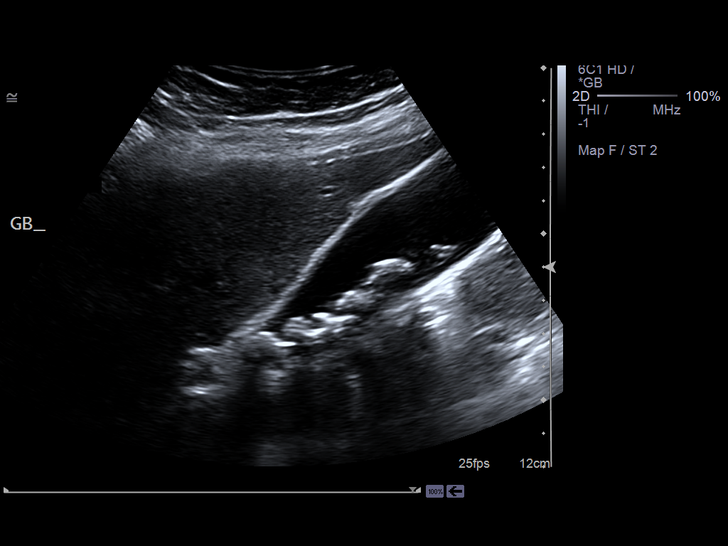

[14 of 25 positions shown; findings below may reference images not displayed]

FINDINGS: Visualized portions of the liver demonstrate normal echogenicity and normal
contours. The liver is without evidence of a focal hepatic lesion.

There are cholelithiasis. There is no intra- or extrahepatic biliary ductal
dilatation. The common duct measures 3.7 mm in maximal diameter. The
gallbladder wall thickness measures 4 mm. There is no pericholecystic fluid.
There is a positive sonographic Murphy sign.

The visualized portion of the pancreas is normal in echogenicity.
IMPRESSION: Cholelithiasis with mild gallbladder wall thickening and a positive
sonographic Murphy sign concerning for acute cholecystitis. If there is
further clinical concern regarding cholecystitis,  recommend a HIDA scan.

[REDACTED]

## 2015-04-15 NOTE — Discharge Summary (Signed)
PATIENT NAME:  Erika Wright, Erika Wright MR#:  [id number] DATE OF BIRTH:  02-03-1991  DATE OF ADMISSION:  03/24/2013 DATE OF DISCHARGE:  03/26/2013  DIAGNOSES: Acute cholecystitis, with cholelithiasis and morbid obesity.   PROCEDURES: Laparoscopic cholecystectomy.   HISTORY OF PRESENT ILLNESS AND HOSPITAL COURSE: This is a 24 year old female patient with several hours of unrelenting right upper quadrant pain with nausea and epigastric pain as well. A workup showed likely acute cholecystitis. She was taken to the operating room where a laparoscopic cholecystectomy was performed without complications. She made an uncomplicated postoperative recovery, was discharged in stable condition with oral pain medications, to follow up in my office in 10 days, with instructions to return or call if she had any worsening of her symptoms.    ____________________________ Adah Salvageichard E. Excell Seltzerooper, MD rec:dm D: 04/02/2013 11:51:58 ET T: 04/02/2013 12:24:57 ET JOB#: [id number]  cc: Adah Salvageichard E. Excell Seltzerooper, MD, <Dictator>

## 2015-04-15 NOTE — Op Note (Signed)
PATIENT NAME:  Erika HammingCRUMPTON, SHANIKA S MR#:  [id number] DATE OF BIRTH:  [birth year]  DATE OF OPERATION:  03/24/2013  PREOPERATIVE DIAGNOSIS: Acute cholecystitis.   POSTOPERATIVE DIAGNOSIS: Acute cholecystitis.   PROCEDURE: Laparoscopic cholecystectomy.   SURGEON: Dionne Miloichard Avary Pitsenbarger, M.D.   ASSISTANT: Simone CuriaKatie Shuler, PA-S.   ANESTHESIA: General with endotracheal tube.   INDICATIONS: This is a patient with right upper quadrant pain and workup showing gallstones and acute cholecystitis.   Preoperatively, we discussed the rationale for surgery, the options of observation, risks of bleeding, infection, recurrence of symptoms, failure to resolve her symptoms, open procedure, bile duct damage, bile duct leak, retained common bile duct stone, any of which could require further surgery, and/or ERCP, stent and papillotomy. This was all reviewed for her in the preop holding area. She understood and agreed to proceed.   FINDINGS: Acute edematous cholecystitis with gallstone, small cystic duct.   DESCRIPTION OF PROCEDURE: The patient was induced to general anesthesia. She was on IV antibiotics. VTE prophylaxis was in place. She was prepped and draped in a sterile fashion. Marcaine was infiltrated in the skin and subcutaneous tissues around the periumbilical area. An incision was made. Veress needle was placed. Pneumoperitoneum was obtained and a 5 mm trocar port was placed. The abdominal cavity was explored, and under direct vision a 10 mm epigastric port and 2 lateral 5 mm ports were placed. The gallbladder was found to be greatly edematous. It was placed on tension. The peritoneum over the infundibulum was incised bluntly. The cystic duct/gallbladder junction was well-identified, doubly-clipped and divided. The cystic artery was doubly-clipped and divided, and the gallbladder was taken from the gallbladder fossa  with electrocautery. A lateral vessel was doubly-clipped and divided as well. The gallbladder was passed  out through the epigastric port site with the aid of an EndoCatch bag. The area was checked for hemostasis and irrigated with copious amounts of normal saline. There was no sign of bleeding, bile leak, or bowel injury. The camera was placed in the epigastric site to view back to the periumbilical site. There was no sign of adhesions, and no sign of bowel injury. Therefore, pneumoperitoneum was released. All ports were removed. Fascial edges at the epigastric site were approximated with figure-of-eight 0 Vicryls; 4-0 subcuticular Monocryl was used on all skin edges. Steri-Strips, Mastisol, and sterile dressings were placed.   The patient tolerated the procedure well. There were no complications. She was taken to the recovery room in stable condition to be to be admitted for continued care.    ____________________________ Adah Salvageichard E. Excell Seltzerooper, MD rec:dm D: 03/24/2013 10:53:00 ET T: 03/24/2013 11:42:29 ET JOB#: [id number]  cc: Adah Salvageichard E. Excell Seltzerooper, MD, <Dictator> Lattie HawICHARD E Kassidee Narciso MD ELECTRONICALLY SIGNED 03/25/2013 7:26

## 2015-04-15 NOTE — H&P (Signed)
PATIENT NAME:  Erika Wright, Erika Wright MR#:  [id number] DATE OF BIRTH:  1991-04-29  DATE OF ADMISSION:  03/24/2013  PRIMARY CARE PHYSICIAN: None.   ADMITTING PHYSICIAN: Tiney Rougealph Ely, MD  CHIEF COMPLAINT: Abdominal pain, nausea and vomiting.   BRIEF HISTORY: Erika LewisShanika Wright is a 24 year old woman with several hour history of significant midepigastric chest pain and right upper quadrant pain associated with marked nausea and vomiting. She had a similar episode 2 days ago which lasted about 2 to 3 hours and resolved spontaneously. It too was associated with nausea and vomiting. It was the first episode she had experienced. This episode did not resolve spontaneously. She has no fever or chills. She denies any change in her bowel habits. She presented to the Emergency Room for further evaluation. Work-up revealed normal laboratory values. Ultrasound demonstrated multiple stones, possible impacted cystic duct stone, and mild gallbladder wall thickening. No pericholecystic fluid. The surgical service was consulted.   She denies any history of hepatitis, yellow jaundice, pancreatitis, peptic ulcer disease, previous diagnosis of gallbladder disease or diverticulitis. She has no hypertension, diabetes or thyroid disease. 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 She develops mild shortness of breath, which was symptomatic, but denies any palpitations. She takes no medications regularly and has no medical allergies. Only previous surgery has been 2 C-sections. She smokes cigarettes and does not drink a significant amount of alcohol. Review of systems otherwise is unremarkable.   PHYSICAL EXAMINATION: GENERAL: She is an alert, pleasant woman in moderate distress from pain.  VITALS: Blood pressure is 100/58, heart rate 67 and regular and temperature 98.1. Pain scale is a 9.  HEENT:  No scleral icterus. No pupillary abnormalities. No facial deformities.  NECK: Supple, nontender. Midline trachea. No adenopathy.  LUNGS:  Chest is clear with no adventitious sounds and normal pulmonary excursion.  HEART:  No murmurs or gallops to my ear and she seems to be in normal sinus rhythm. I cannot hear any murmurs.  ABDOMEN: Generally soft with some mild midepigastric tenderness. No rebound. No guarding. She has hypoactive but present bowel sounds.  LOWER EXTREMITIES: No deformity. Full range of motion. Good distal pulses.  PSYCHIATRIC: Normal affect. Normal orientation.   LABORATORY DATA:  Values reveal a slightly elevated glucose of 110 and slightly elevated depressed calcium at 83. White count is normal with a hemoglobin of 12.2. Urinalysis is largely unremarkable with the exception of some protein in her urine. She does have 20 white cells. She had been given a dose of Ancef for possible urinary tract.   ASSESSMENT AND PLAN: This woman appears to present with biliary colic, possible acute cholecystitis. This is her second episode in 3 to 4 days. We will plan to admit her to the hospital, put her on antibiotic therapy, and prepare her for possible surgical intervention. This plan has been discussed with the patient in detail and she is in agreement. I will discuss the case with the oncoming surgeon, Dr. Dionne Miloichard Cooper. ____________________________ Quentin Orealph L. Ely III, MD rle:sb D: 03/24/2013 06:49:55 ET T: 03/24/2013 07:29:28 ET JOB#: [id number]  cc: Quentin Orealph L. Ely III, MD, <Dictator> Quentin OreALPH L ELY MD ELECTRONICALLY SIGNED 03/24/2013 20:34

## 2015-04-15 NOTE — Discharge Summary (Signed)
PATIENT NAME:  Erika Wright, Erika Wright MR#:  [id number] DATE OF BIRTH:  06-Jan-1991  DATE OF ADMISSION:  03/24/2013 DATE OF DISCHARGE:  03/26/2013  DIAGNOSES: Acute cholecystitis, with cholelithiasis and morbid obesity.   PROCEDURES: Laparoscopic cholecystectomy.   HISTORY OF PRESENT ILLNESS AND HOSPITAL COURSE: This is a 24 year old female patient with several hours of unrelenting right upper quadrant pain with nausea and epigastric pain as well. A workup showed likely acute cholecystitis. She was taken to the operating room where a laparoscopic cholecystectomy was performed without complications. She made an uncomplicated postoperative recovery, was discharged in stable condition with oral pain medications, to follow up in my office in 10 days, with instructions to return or call if she had any worsening of her symptoms.    ____________________________ Adah Salvageichard E. Excell Seltzerooper, MD rec:dm D: 04/02/2013 11:51:00 ET T: 04/02/2013 12:24:57 ET JOB#: [id number] Lattie HawICHARD E Kaled Allende MD ELECTRONICALLY SIGNED 04/06/2013 19:12

## 2015-04-16 NOTE — Discharge Summary (Signed)
PATIENT NAME:  Erika Wright, Erika Wright MR#:  [id number] DATE OF BIRTH:  [birth year]  DATE OF ADMISSION:  01/04/2014 DATE OF DISCHARGE:  01/05/2014  HISTORY OF PRESENT ILLNESS: The patient is a 24 year old gravida 5, para 3-0-2-3, African American female, who presents 18 days status post cesarean section secondary to having prior history of 2 cesarean sections as well as the fact that she came in with contractions and very early labor on Christmas Day. C-section was performed at that time with 250 mL of blood loss and no complications at that time. The patient did have some anemia on the first postoperative day with some orthostatic hypotension and was transfused 2 units at that time. On the third postoperative day, she was doing well without any signs of hypotension, tachycardia, weakness, fatigue or anemia and she was on iron therapy to improve any low-grade anemia at that time. The patient was sent home and was doing quite well for 2 weeks.   The patient was noted on the day of January 04, 2014 to have sudden onset brisk vaginal bleeding. She became quite lightheaded and dizzy following this. Immediately an ambulance was called to her home and she came to the Emergency Department where she was hypotensive and tachycardic with continued profuse vaginal bleeding. At that time, she was seen by the ER physicians as well as OB/GYN and she was taken to the operating room for a D and C procedure to try and stabilize the bleeding and determine if there was any retained products. D and C resulted in minimal removal of any tissue and a Bakri balloon was placed to help obtain hemostasis. The patient went to the PACU and at first there was minimal to no blood in the Bakri balloon collection device and she was relatively stable. Within 20 to 30 minutes, her blood pressure dropped and the Bakri balloon filled up with blood quite quickly. At this point, she was taken back to the operating room with both Dr. Janene Harvey of OB/GYN as  well as Dr. Anda Kraft from general surgery to do an exploratory laparotomy. The  laparotomy at that time determined that she had an extensive retroperitoneal hematoma as well as some bleeding in and around the uterus. There was a small perforation mark in the posterior aspect of the uterus, likely from placement of the Bakri balloon that was minimally bleeding, but stitches were placed just the same. B-Lynch sutures were placed around the uterus. The uterine arteries could not be reached safely due to the retroperitoneal hematoma as well as adhesions from her multiple prior surgeries to safely ligate the uterine arteries. As bleeding was stabilized at this time, no further procedures were taken. The risk of evacuating the retroperitoneal hematoma due to her hemodynamic status was not undertaken.   She had a small vaginal laceration that was packed and repaired and packing sponge went with her to the recovery room. At this point in time, the patient did go to the recovery room as well then ICU for continued hemodynamic control and monitoring for bleeding. During this time, she was transfused multiple units of packed red blood cells as well as fresh frozen plasma, platelets and cryoprecipitate.   At this time, the patient is in the ICU and she is on several medications including Levophed, propofol, fentanyl and antibiotics. She is receiving more blood products and is being monitored for DIC. She has stabilized in her bleeding, but there is still the fact that she has the retroperitoneal hematoma and the risk that  it could cause moving forward. The advantages of interventional radiology versus repeat operation is an important factor in considering transfer to a tertiary care center where these services could be obtained. The patient's condition has been discussed with her boyfriend/fianc and other family members as the patient is unconscious at this time due to medications. The pros and cons of transfer to a  tertiary care center where other services are available and for readily available services if she did bleed again as well as interventional radiology. The patient's family was also counseled on the risk of transfer including the risk in the ambulance if her condition worsening, which could be quite difficult until she got to the receiving hospital's ICU.   The patient diagnoses include retroperitoneal hematoma, DIC and hypovolemic shock. I do not feel there is a septic shock component as there is no evidence for endometritis or infected or necrotic uterus at the time of the surgery, which would be the main source of the septic shock. She had a white count of 9 upon admission and again this seems to be more hypovolemic  shock from blood loss; however, I do defer judgment to internal medicine physicians both here and at receiving facility for exactly what could be going on and the future care she receives for treatment of her hypovolemia, anemia and other cardiovascular parameters.   Again, at this time, we are arranging for transfer to tertiary care facility as available and maintaining hemodynamic stability in the ICU with the help of other physicians and internal medicine and cardiology.  ____________________________ R. Annamarie MajorPaul Gazella Anglin, MD rph:aw D: 01/05/2014 10:58:16 ET T: 01/05/2014 11:24:37 ET JOB#: [id number]  cc: Dierdre Searles. Paul Lorene Samaan, MD, <Dictator> Nadara MustardOBERT P Ali Mohl MD ELECTRONICALLY SIGNED 01/05/2014 20:32

## 2015-04-16 NOTE — Op Note (Signed)
PATIENT NAME:  Erika Wright, Erika Wright MR#:  [id number] DATE OF BIRTH:  [birth year]  DATE OF PROCEDURE:  01/05/2014  PREOPERATIVE DIAGNOSIS: Postpartum, more than 3 weeks postcesarean section hemorrhage presenting to the Emergency Room.   POSTOPERATIVE DIAGNOSIS: Postpartum, more than 3 weeks postcesarean section hemorrhage presenting to the Emergency Room.  PROCEDURE: Placement of Cytotec, mild curettage of the uterus and suction of clots from the vagina and os of the cervix.   ESTIMATED BLOOD LOSS: Approximately 2000 mL.   SURGEON: Elliot Gurneyarrie C. Caly Pellum, M.D.   FINDINGS: Very, very deep vagina, approximately 10 to 12 inches deep, with difficulty in finding the displaced very small cervix. The cervix was approximately 1 inch in length, slightly open approximately 1 to 2 cm at the end of a very deep vagina. Small amount of lochia/exudate with a few small, approximately 0.5 cm, calcifications, bright red blood and blood clots but no true placenta pieces.   DESCRIPTION OF PROCEDURE: The patient was taken to the operating room and placed in supine position. After adequate general endotracheal anesthesia was instilled, the patient was prepped and draped in the usual sterile fashion. Foley catheter was placed, and side-opening speculum was placed in the patient'Wright vagina. It was found that the side-opening speculum only went partway down the patient'Wright vagina. The storeroom was then raided to find instruments that were long enough and deep enough for the patient'Wright vagina. Malleable and the extra long weighted speculum was used to hold back the vaginal tissue on the patient'Wright left and posterior aspect of the vagina and another long weighted speculum was used at the top to tube hold the bladder out of the way. The cervix was identified and grasped with a single-tooth tenaculum. The uterus was sounded and sounded to approximately 9 to 10 cm. The small curette was then used to gently removed clots from the cervix and the  uterus; however, the vagina and the cervix were so deep the curetting was not able to be done aggressively since the curette was only able to be held with 2 fingers. Small amounts of calcified particles were removed as well as old blood clots. Then, the suction curette was placed into the vagina to remove the clots and tissue and gently into the patient'Wright cervix; however, suction was not able to be obtained as the depth of the vagina accommodated almost the entire length of the suction curette and vacuum suction was not able to be completed as the (Dictation Anomaly) <<MISSING TEXT>> kept being pushed off of the opening of the tubing which released the vacuum. This was then abandoned, and the Bakri balloon was then threaded with the long pickups into the cervix and very gently curetted with the tips of a pickups into the uterus. The 60 mL syringe was then attached, and the syringe was inflated to approximately 220 mL, which is 3-1/2 syringeful, with sterile fluid. There was muscle pushed back from the uterus when the bulb was inflated enough to touch the inside of the uterus. The bleeding stopped. The Bakri balloon was found to be anchored into the uterus. The blood was continued to be given by anesthesia. Clear urine was noted in the Foley, and the patient was taken to recovery after having tolerated the procedure well.   ____________________________ Elliot Gurneyarrie C. Arryanna Holquin, MD cck:gb D: 01/07/2014 00:55:18 ET T: 01/07/2014 01:29:49 ET JOB#: [id number]  cc: Elliot Gurneyarrie C. Cowen Pesqueira, MD, <Dictator>

## 2015-04-16 NOTE — H&P (Signed)
   Subjective/Chief Complaint bleeding   History of Present Illness 24 yo G1P1 who had a csection the  day oafter christmas and she had a drop in her hematocrit from 34 to 19, she had minimal bleeding during the surgery and not much pp but given her pulse and the crit drop she was given two units of blood. hct prior to discharge was 24.4. hct on arrical today before blood was 26.5. her WBC  is normal and her platelets are slightly elevated. she was at home when she thought she was peeing on herself and bled and then she alsopassed out. EMS was called and found her unresponsive and she was 70 over palp BP nad lethargic and pale. she is in the ER and she has moments where the blood gushes despite the pitocin in the fluid. and then her pressure drops and her pulse goes up. she is on the 3 and 4th unit of blood and she is curreb]\nt.ly with bp 50/.26 and p 114. she is sopmnolent and pale and nonrespoinsive exceprt to deep pain. vagina cleawred of clots and 800 mcg of cytotec placed in teh rectrum   Past History csction on chrsitmas day   Past Med/Surgical Hx:  leaking heart valve:   Denies medical history:   gall stones removed:   c section:   ALLERGIES:  No Known Allergies:   Family and Social History:  Family History Non-Contributory   Social History negative tobacco, negative ETOH   Place of Living Home   Review of Systems:  Subjective/Chief Complaint bleeding and passing out   Fever/Chills No   Cough No   Sputum No   Abdominal Pain No   Diarrhea No   Constipation No   Nausea/Vomiting No   SOB/DOE No   Chest Pain No   Tolerating Diet npo   Medications/Allergies Reviewed Medications/Allergies reviewed   Physical Exam:  GEN disheveled, palr anad lethargic un tburg   HEENT pale conjunctivae, dry oral mucosa   NECK No masses   RESP clear BS  decrteased   CARD No LE edema  tachyy   ABD denies tenderness  rigid  distended  hypoactive BS  adominal Mass  uterus    GU foley catheter in place   LYMPH negative neck   EXTR negative cyanosis/clubbing   SKIN skin turgor poor   NEURO cranial nerves intact   PSYCH alert   Additional Comments wil get bloopf and ffp and gp tp or for d and c possible hysterectomy    Assessment/Admission Diagnosis bleeidng pp   Plan d and c   Electronic Signatures: Adria DevonKlett, Jaime Grizzell (MD)  (Signed 12-Jan-15 21:04)  Authored: CHIEF COMPLAINT and HISTORY, PAST MEDICAL/SURGIAL HISTORY, ALLERGIES, FAMILY AND SOCIAL HISTORY, REVIEW OF SYSTEMS, PHYSICAL EXAM, ASSESSMENT AND PLAN   Last Updated: 12-Jan-15 21:04 by Adria DevonKlett, Sun Wilensky (MD)

## 2015-04-16 NOTE — Op Note (Signed)
PATIENT NAME:  Erika Wright, Erika Wright MR#:  [id number] DATE OF BIRTH:  Feb 20, 1991  DATE OF PROCEDURE:  01/05/2014  PREOPERATIVE DIAGNOSIS:  Post dilatation and curettage bleeding discovered in the PACU.   POSTOPERATIVE DIAGNOSIS:  Post dilatation and curettage bleeding discovered in the PACU.  PROCEDURE:  Exploratory laparotomy, repair of uterine tear, B-Lynch suture, running of the bowels, investigation of the bladder and right pelvic broad ligament hematoma.   ESTIMATED BLOOD LOSS:  Approximately another 600 mL.   SURGEON:  Elliot Gurney, M.D.   ASSISTANT:  Dr. Loney Hering.   FINDINGS:  Bright red ooze from the vagina with intact Bakri balloons in the proper place with a small rupture of the uterus in the posterior aspect.  A large approximately 16 to 16-18 week size right broad ligament hematoma that tracked up to the infundibulopelvic ligament up over the pelvic brim, unable to identify the ureter with good pulses on the right and the left from the uterine arteries, intact bladder adhered to the incision and to the uterus as well as normal tubes and ovaries bilaterally, normal bowel run, tear in the backs of the uterus approximately 1 to 2 inches in length with a small bleeding blood vessel within the myometrium.  No hemoperitoneum, only a small amount of blood, which was old and dark.   DESCRIPTION OF PROCEDURE:  The patient was taken to the Operating Room and placed in supine position.  Ultrasound was present and performed, ultrasound of the pelvis transabdominally.  The Bakri balloon was identified and followed through to the uterus.  The uterus was found to be the same size as the Bakri balloon, however there was an odd-shaped mass lateral to the uterus with whorl, what appeared to be either tissue or old clot in approximately the same shape of the uterus.  This was then used as the decision to approach the bleeding abdominally versus vaginally.  The Bakri balloon attachment was removed  and the fluid was allowed to drain from the balloon.  Incision was made through the old C-section incision and carried sharply down to the fascia.  The fascia was nicked in the midline.  The deep tissue was quite inflammatory.  There was lots of granulation tissue and tissue planes were not perfect.  When the decision had been made to go abdominally 2 grams of Ancef were ordered and given in the thoughts that a hysterectomy would be performed.  Dr. Anda Kraft was called at this time.  The midline of the muscles was identified and split.  The rectus fascia was dissected off the rectus muscles.  The abdominal muscles were relaxed as they were quite tight.  Bladder blade was placed, but the bladder was found to be adherent to the incision.  This was bluntly and sharply dissected off the muscle and fascia.  The surgeon's hand was placed into the abdomen for a survey and the uterus was identified which was slightly boggy and a large mass on the right of the pelvis was identified which was quite hard and approximately double the size of the uterus.  At this time this was realized to be a retroperitoneal broad ligament hematoma side wall.  The uterus was identified.  A Bookwalter was placed.  The bowels were packed back.  There was no blood in the abdomen and the uterus was elevated.  The uterus was massaged to see if it would firm up and blood exuded from the uterine opening on the backside.  PDS, and then a  chromic suture was used in a running locked fashion to approximate the muscles and create hemostasis.  At this time the Bakri balloon was removed vaginally after releasing of the fluid from the balloon.  The uterus was placed to the side and attention was turned to the hematoma.  At this point the uterine arteries were identified and the decision was made to search for the uterine (Dictation Anomaly) artery on the left side to possibly ligate them, however due to the adhesions and scarring of the tissue they were found  to be inaccessible on the right due to the hematoma and very deep on the left.  Given the stability of the patientand no more bleeding of the uterus, the decision was made to not perform a hysterectomy as vascular surgery was not available for dissection of the broad ligament and sidewall.  There were no GYN oncologists available and given that the patient's bleeding was finally stabilized, the decision was made to keep the patientt stabilized prevent any further the bleeding and then transfer her where she could have access to surgical specialties not available at our hospital.  A B-Lynch stitch of chromic was then performed on the uterus. between the backside of the uterus was placed a layer of Gelfoam, then Avitene, then Gelfoam.  This was then placed against the incision.  The B-Lynch suture was placed over the top of the uterus x 2.  This was then cinched down with the Gelfoam in between the suture and the uterus for good hemostasis.  The uterus was clamped down to approximately eight-week size.  This was then watched to see if it would expand.  There was no expansion.  No increase in fullness.  No increase in the bleeding.  The drapes were pulled back and the vagina was observed to see if there was any vaginal draining and bleeding and this was found to be a minimal amount.  There was a small abrasion on the patient's left vaginal sidewall that did ooze a little bit.  Given that the patient was going to the ICU, the vagina was packed with a Kerlix with a tail to watch the bleeding, but to also hold pressure on the vagina.  The patient was then again laid supine.  Clear urine was noted in the Foley bag.  The muscle bellies were approximated in the midline with Vicryl suture with use of a malleable to protect the bowel, after the bowels had been run and found to be normal.  The ureter was identified on the left, but unable to be found on the right due to the hard hematoma.  The fascia was then closed from the  angle to the midline with two locked 0 PDS sutures.  The T53 was then used in plain gut to approximate the subcutaneous fat.  Skin clips were placed after loosening of the scar tissue of the previous incision.     The patient was then laid supine, a pressure bandage was placed on the incision.  The FFP we had and cryo we had and caslcium citrate as well as called fro more blood to give to her was given as available. the platelets were called for again and the patient was taken to recovery after tolerating the procedure well.  She was then transported to the ICU for continued stabilization and transferred to another facility.  Of note, upon arrival into the hospital the family was asked if this was the first child, the boyfriend answered yes. in fact was  the patient's third C-section, but his first child with her.   ____________________________ Elliot Gurney, MD cck:ea D: 01/07/2014 01:10:21 ET T: 01/07/2014 02:41:34 ET JOB#: [id number]  cc: Elliot Gurney, MD, <Dictator> Elliot Gurney MD ELECTRONICALLY SIGNED 01/10/2014 22:24

## 2015-04-16 NOTE — Consult Note (Signed)
PATIENT NAME:  Erika Wright, Erika Wright MR#:  [id number] DATE OF BIRTH:  [birth year]  MEDICAL CONSULTATION   DATE OF CONSULTATION:  01/05/2014  REFERRING PHYSICIAN:  Elliot Gurneyarrie C. Klett, MD CONSULTING PHYSICIAN:  Ramonita LabAruna Cloma Rahrig, MD  REASON FOR CONSULTATION: Syncope.   HISTORY OF PRESENT ILLNESS: The patient is a 24 year old G1, P1 who had a C-section on 26th of December and dropped her hematocrit from 34 to 19 following that. As per OB records, the patient had minimal bleeding during the surgery and did not have much postpartum, but as her pulse dropped and hematocrit was dropped, she was given 2 units of blood during that time. As reported by them, prior to discharge, the patient'Wright hematocrit was 24.4. Last night while she was at home, the patient thought she was peeing on herself, but actually she bled, felt dizzy and passed out. By the time EMS went there, she was found to be unresponsive. The patient was found to be lethargic and pale. Her palpable blood pressure was at 70. She was immediately brought into the ER, and they noticed blood gushing out of the vaginal area despite Pitocin in the fluid. The patient was found to be hypotensive and tachycardic. The patient'Wright blood pressure at 1900 hours was at 91/16, but subsequently dropped down to 78/52 with a pulse of 101 at 2035 hours on January 12th. The patient was found to be in hypovolemic shock, and she was taken to OR by the OB, and she had D and C done. As [facility], the patient has received 4 units of PRBC and 6 liters of lactated Ringers. Two units of FFP is ordered, which is pending at this time. I have seen this patient in PACU following D and C. Though the patient is lethargic, she was arousable during my examination. The patient was complaining of lower abdominal pain. She denies any complications during pregnancy. No family members are found at bedside. She denies any history of bleeding problems prior to pregnancy. I was unable to get  much history from her as the patient is lethargic, and they are taking her to OR again as she started bleeding while she was in PACU. While the patient was in PACU, her blood pressure was at 101/55 with pulse of 107, and the patient was saturating at 100%. At one point, blood pressure dropped down to 87/52, and the patient was immediately taken to OR for further evaluation.   PAST MEDICAL HISTORY: Leaking heart valve.   PAST SURGICAL HISTORY: Recent C-section on 12/18/2013, cholecystectomy.   ALLERGIES: No known drug allergies.   PSYCHOSOCIAL HISTORY: Lives at home with family. According to old records, no smoking, alcohol or illicit drug usage.   FAMILY HISTORY: Noncontributory.   REVIEW OF SYSTEMS: Unobtainable as the patient is very lethargic.   PHYSICAL EXAMINATION:  VITAL SIGNS: IN PACU, during my examination, blood pressure 101/55, pulse 107, saturating 100%, respiratory rate 15 to 16. Subsequently, blood pressure dropped down to 87/52. GENERAL APPEARANCE: The patient looks lethargic and pale.  HEENT: Normocephalic, atraumatic. Pupils are equally reacting to light and accommodation. No conjunctival injection, pale conjunctivae. No nasal congestion. Dry mucous membranes.  NECK: Supple. No JVD. No thyromegaly.  LUNGS: Clear to auscultation bilaterally. No accessory muscle usage. No anterior chest wall tenderness on palpation.  CARDIAC: S1, S2 normal. Regular rate and rhythm, tachycardic.  GASTROINTESTINAL: Soft and diffusely tender.  NEUROLOGIC: The patient is very lethargic, arousable, but falling asleep. VAGINAL: Deferred to OB.  EXTREMITIES: No  cyanosis. No clubbing.  SKIN: Extremities are cold, but the rest of the body is warm to touch. No rashes. No lesions. No petechiae, purpura. No mottling.   LABORATORY AND IMAGING STUDIES: WBC is elevated at 15.4, hemoglobin 9.5, hematocrit is 29.0, platelets 408. PT 16.7, INR is 1.4, activated PTT is 30.8. Glucose 108, BUN 9, creatinine 0.73,  sodium 143, potassium 4.0, chloride 114, CO2 23, anion gap is 6, GFR of greater than 60, serum osmolality 284, calcium 7.1.   ASSESSMENT AND PLAN: A 24 year old African-American female brought into the ER with profuse vaginal bleeding and passing out. Is admitted to [facility] service, and medical consult is placed regarding syncope.   1. Syncope secondary to hypovolemic shock from severe postpartum hemorrhage.  The patient'Wright situation is critical, still bleeding after dilation and curettage. Now going to OR again.  The patient needs aggressive hydration, status post PRBC. Will continue blood transfusion and FFP.  Will consider cryoprecipitate if fibrinogen is low.  STAT labs are ordered to rule out disseminated intravascular coagulation, which is a possibility from sepsis and also from profuse loss of clotting factors from severe postpartum hemorrhage.  2. Septic shock. Aggressive hydration with IV fluids and antibiotics. The patient has already received antibiotics by OB. If necessary, will consider pressors.  3. History of leaking heart valve. Will obtain an echocardiogram and 12-lead EKG. Cardiology consult is placed to on-call cardiologist, Dr. Adrian Blackwater. The patient will be monitored on telemetry.  4. Will provide gastrointestinal prophylaxis with Protonix IV and deep vein thrombosis prophylaxis with SCDs.   CODE STATUS: She is full code. Need to discuss with the family, situation is critical.   TOTAL CRITICAL CARE TIME SPENT: 50 minutes.   Thank you, Dr. Janene Harvey, for allowing [facility] to take care of this patient.   ____________________________ Ramonita Lab, MD ag:lb D: 01/05/2014 00:53:49 ET T: 01/05/2014 06:11:33 ET JOB#: [id number]  cc: Ramonita Lab, MD, <Dictator> Elliot Gurney, MD Ramonita Lab MD ELECTRONICALLY SIGNED 01/14/2014 0:45

## 2015-04-16 NOTE — Op Note (Signed)
PATIENT NAME:  Erika Wright, Erika Wright MR#:  [id number] DATE OF BIRTH:  [birth year]  DATE OF PROCEDURE:  12/17/2013  PREOPERATIVE DIAGNOSES:  1.  Term intrauterine pregnancy. 2.  A prior history of cesarean section. 3.  Premature rupture of membranes.   POSTOPERATIVE DIAGNOSES: 1.  Term intrauterine pregnancy. 2.  A prior history of cesarean section. 3.  Premature rupture of membranes.   PROCEDURES: 1.  Low transverse cesarean section.  2.  Placement of On-Q pain pump.   SURGEON:  Dierdre Searles. Paul Mahalie Kanner, M.D.   ASSISTANT:  [address], technician.   ANESTHESIA:  Spinal.   ESTIMATED BLOOD LOSS:  250 mL.   COMPLICATIONS:  None.   FINDINGS:  Normal tubes, ovaries and uterus. Viable female weighing 5 pounds, 6 ounces with Apgar scores of 8 and 9 at 1 and 5 minutes, respectively.   DISPOSITION:  Recovery Room in stable condition.   TECHNIQUE:  The patient is prepped and draped in the usual sterile fashion after adequate anesthesia is obtained in the supine position on the Operating Room table. An incision was made with a scalpel in the lower abdomen in the area of the prior scar. A scalpel was used to carry out the incision down the level of the rectus fascia, which was then dissected bilaterally using Mayo scissors. The rectus muscles are dissected away from the rectus fascia and then separated in the midline. The peritoneum is penetrated and the bladder is inferiorly dissected and retracted. A scalpel was used to create a low transverse hysterotomy incision that is extended by blunt dissection. The amniotomy reveals clear fluid. The infant'Wright head is grasped and delivered without the use of a suction device and the oropharynx is suctioned. The infant is delivered and then handed to the pediatric team.   Cord blood is obtained. The placenta is manually extracted. The uterus is cleansed of all membranes and debris using a moist sponge and then is externalized. The hysterotomy incision is closed with a  running #1 Vicryl suture in a locking fashion followed by a second layer to embrocate the first layer with excellent hemostasis noted. The uterus is placed back in the intra-abdominal cavity and the paracolic gutters are irrigated with warm saline. Re-examination of the incision reveals excellent hemostasis. The peritoneum is then closed with 0 Vicryl suture.   The trocars for the On-Q pain pump are then placed through the abdomen into the subfascial space and then the Silver soaker catheters are threaded into place. Maxon is used to close the fascia without incorporation of the catheters. The subcutaneous tissues are irrigated and hemostasis is assured using electrocautery and the skin is closed with 4-0 Vicryl suture in a subcuticular fashion followed by placement of Dermabond. The catheters are flushed with 5 mL each of bupivacaine and then secured in place using Steri-Strips and a Tegaderm bandage. The patient goes to the Recovery Room in stable condition. All sponge, instrument and needle counts are correct.   ____________________________ R. Annamarie MajorPaul Emily Massar, MD rph:jm D: 12/17/2013 13:34:24 ET T: 12/17/2013 18:03:09 ET JOB#: [id number]  cc: Dierdre Searles. Paul Lis Savitt, MD, <Dictator> Nadara MustardOBERT P Acen Craun MD ELECTRONICALLY SIGNED 12/30/2013 7:17

## 2015-04-16 NOTE — Consult Note (Signed)
PATIENT NAME:  Erika HumbleCRUMPTON, Natisha S MR#:  [id number] DATE OF BIRTH:  1991-03-14  CARDIOLOGY CONSULTATION   DATE OF CONSULTATION:  01/05/2014  CONSULTING PHYSICIAN:  Laurier NancyShaukat A. Berenis Corter, MD  INDICATION FOR THE CONSULTATION: Sinus tachycardia and history of hypotension.   HISTORY OF PRESENT ILLNESS: This is a 24 year old African-American female who apparently came into the hospital hypotensive and was tachycardic. Thus, I was asked to evaluate the patient. It appears the patient had delivery by C-section after Christmas, and since then, she has been not doing very well and became anemic and dropped her hemoglobin from 34 to 19. She underwent D and C, and since then, the patient has not done well. Last night, came in with blood pressure 50/26, heart rate of 114, with a lot of clotting in the rectum as well as in the vagina. At this time, she remains intubated. Blood pressure is 110/60, respirations are 18, heart rate is 120. Her hemoglobin, however, is 4. It appears that the patient is in DIC.   PAST MEDICAL HISTORY: History of valvular heart disease. It is unclear what type of valvular heart disease she had. Her other past medical history: As mentioned, she had a C-section on 12/18/2013.   ALLERGIES: None.   SOCIAL HISTORY: Unremarkable.   FAMILY HISTORY: Unremarkable.   PHYSICAL EXAMINATION:  GENERAL: Alert and oriented x0.  VITAL SIGNS: Blood pressure 104/31 right now, respiration 20, pulse 122, temperature 98.1, saturation 100.  NECK: No JVD.  LUNGS: Good air entry.  HEART: Tachycardic. Normal S1, S2. No audible murmur.  ABDOMEN: Soft and nontender.  EXTREMITIES: No pedal edema.  NEUROLOGIC: She is alert and oriented x0.   LABORATORY DATA AND STUDIES: EKG shows sinus rhythm at 65 beats per minute that is in the chart. No acute changes. Her hemoglobin is 4.2, white count is 13.4, platelet count is 235. Her BUN is 10, creatinine 0.88, glucose is 178, calcium is 6.7, magnesium 1.0, albumin 1.5.    ASSESSMENT AND PLAN: The patient is severely anemic. It appears that the patient may be in disseminated intravascular coagulation after surgery. She is getting blood transfusion and being consulted by various physicians, including hematologist/oncologist. Because the magnesium is low, I would recommend replacing the magnesium, and I would recommend getting an echocardiogram done to evaluate her left ventricular systolic function, plus she had valvular heart disease in the past, it is unclear, most likely she probably had mitral regurgitation. Will look at that. It does not seem like there is a major cardiac issue right now because EKG is unremarkable, and it seems like the patient is having everything related to hematological problem as well as related to severe anemia. Will continue to follow the patient with you. Thank you very much for referral.   ____________________________ Laurier NancyShaukat A. Jarrah Seher, MD sak:lb D: 01/05/2014 08:24:31 ET T: 01/05/2014 08:35:04 ET JOB#: [id number]  cc: Laurier NancyShaukat A. Ezra Denne, MD, <Dictator> Laurier NancySHAUKAT A Rex Magee MD ELECTRONICALLY SIGNED 01/06/2014 10:11

## 2015-04-16 NOTE — Consult Note (Signed)
Came to see patient in CCU. [facility]  Electronic Signatures: Izola PricePandit, Massimo Hartland Raj (MD)  (Signed on 13-Jan-15 13:43)  Authored  Last Updated: 13-Jan-15 13:43 by Izola PricePandit, Ricke Kimoto Raj (MD)

## 2015-04-16 NOTE — Op Note (Signed)
PATIENT NAME:  Erika Wright, [name] MR#:  [id number] DATE OF BIRTH:  [birth year]  DATE OF PROCEDURE:  01/04/2014  PREOPERATIVE DIAGNOSIS: Postpartum, more than 3 weeks postcesarean section hemorrhage presenting to the Emergency Room.   POSTOPERATIVE DIAGNOSIS: Postpartum, more than 3 weeks postcesarean section hemorrhage presenting to the Emergency Room.  PROCEDURE: Placement of Cytotec, mild curettage of the uterus and suction of clots from the vagina and os of the cervix.   ESTIMATED BLOOD LOSS: Approximately 2000 mL.   SURGEON: Elliot Gurneyarrie C. Lavonn Maxcy, M.D.   FINDINGS: Very, very deep vagina, approximately 10 to 12 inches deep, with difficulty in finding the displaced very small cervix. The cervix was approximately 1 inch in length, slightly open approximately 1 to 2 cm at the end of a very deep vagina. Small amount of lochia/exudate with a few small, approximately 0.5 cm, calcifications, bright red blood and blood clots but no true placenta pieces.   DESCRIPTION OF PROCEDURE: The patient was taken to the operating room and placed in supine position. After adequate general endotracheal anesthesia was instilled, the patient was prepped and draped in the usual sterile fashion. Foley catheter was placed, and side-opening speculum was placed in the patient's vagina. It was found that the side-opening speculum only went partway down the patient's vagina. The storeroom was then raided to find instruments that were long enough and deep enough for the patient's vagina. Malleable and the extra long weighted speculum was used to hold back the vaginal tissue on the patient's left and posterior aspect of the vagina and another long weighted speculum was used at the top to tube hold the bladder out of the way. The cervix was identified and grasped with a single-tooth tenaculum. The uterus was sounded and sounded to approximately 9 to 10 cm. The small curette was then used to gently removed clots from the cervix and the  uterus; however, the vagina and the cervix were so deep the curetting was not able to be done aggressively since the curette was only able to be held with 2 fingers. Small amounts of calcified particles were removed as well as old blood clots. Then, the suction curette was placed into the vagina to remove the clots and tissue and gently into the patient's cervix; however, suction was not able to be obtained as the depth of the vagina accommodated almost the entire length of the suction curette and vacuum suction was not able to be completed as the cover to the vaccuum opening  kept being pushed off of the opening of the tubing, by the vginal tissue as the end of the suction tubing had to be held with two fingers only.  which released the vacuum. This was then abandoned, and the Bakri balloon was then threaded with the long pickups into the cervix with the tips of a pickups into the uterus. The 60 mL syringe was then attached, and the syringe was inflated to approximately 220 mL. There was muscle push back from the uterus when the bulb was inflated enough to touch the inside of the uterus. The bleeding stopped. The Bakri balloon was found to be anchored into the uterus. The blood was continued to be given by anesthesia. Clear urine was noted in the Foley, and the patient was taken to recovery after having tolerated the procedure well.   ____________________________ Elliot Gurneyarrie C. Cornie Herrington, MD cck:gb D: 01/07/2014 00:55:00 ET T: 01/07/2014 01:29:49 ET JOB#: [id number]  cc: Elliot Gurneyarrie C. Myrtha Tonkovich, MD, <Dictator> Elliot GurneyARRIE C Shanitra Phillippi MD ELECTRONICALLY SIGNED 01/14/2014  9:33 

## 2015-05-03 NOTE — H&P (Signed)
L&D Evaluation:  History:  HPI 24 year old G5 P2022 with EDC=12/23/2013 by a 6wk3day ultrasound presents at 1033 weeks gestation with c/o cramping/lower abdominal pain since yesterday. The pain is intermittent, sharp and radiates to the vagina and is associated with the abdomen tightening.She has had loose stools since yesterday (5-6 yesterday and 2-3x today). Today she has vomited three times after eating solids, but has been able to keep down liquids. + heartburn. Her 26 year old son has just recovered from similar sx. Denies dysuria, URI symptoms. Has had chills. No fever. No VB. [facility] also remarkable for two prior C-sections, obesity (38 BMI-net weight loss with pregnancy)and tx for Trichimonas.   Presents with abdominal pain   Patient's Medical History Obesity   Patient's Surgical History Prior CS x 2; cholecystectomy   Medications Pre Natal Vitamins   Allergies NKDA   Social History none   Family History 26 year old son with GI viral sx   ROS:  ROS see HPI   Exam:  Vital Signs stable  118/63   Urine Protein negative dipstick, sp gravity 1.002, neg ketones, leuks, nitrite   General no apparent distress   Mental Status clear   Chest clear   Heart normal sinus rhythm, no murmur/gallop/rubs   Abdomen gravid, non-tender, Abd NT, soft, no hepatomegaly   Edema no edema   Reflexes 1+   Pelvic no external lesions, closed/50%/-3   Mebranes Intact   FHT normal rate with no decels, 125-130 with accels to 150   Ucx absent   Skin dry   Impression:  Impression IUP at 33 weeks with no evidence of PTL. Probable gastroenteritis. Reactive NST   Plan:  Plan DC home with RX for Phenergan 25 mgm -1/2 to 1 tab q4-6 hours prn. and Zantac 150 mgm po BID prn. [facility] or sooner for worsening sx. Bland diet until nausea resolved. OOW thru 11/14.   Electronic Signatures: Trinna BalloonGutierrez, Maicie Vanderloop L (CNM)  (Signed [phone]-Nov-14 23:58)  Authored: L&D  Evaluation   Last Updated: 12-Nov-14 23:58 by Trinna BalloonGutierrez, Jalaiyah Throgmorton L (CNM)

## 2015-05-03 NOTE — H&P (Signed)
L&D Evaluation:  History Expanded:  HPI 24 yo at 3938 weeks who comes in for possible labor she was seen int he office today for ctx atr 1 cm and she is concerned she may be further dilated and wants to be checked.   Gravida 5   Term 2   PreTerm 0   Abortion 2   Living 2   Blood Type (Maternal) B positive   Group B Strep Results Maternal (Result >5wks must be treated as unknown) positive   Maternal HIV Negative   Maternal Syphilis Ab Nonreactive   Maternal Varicella Immune   Rubella Results (Maternal) immune   Maternal T-Dap Unknown   [facility]   Presents with trauma   Patient's Medical History No Chronic Illness   Patient's Surgical History none   Medications Pre Natal Vitamins   Allergies NKDA   ROS:  ROS All systems were reviewed.  HEENT, CNS, GI, GU, Respiratory, CV, Renal and Musculoskeletal systems were found to be normal.            Exam:  Vital Signs stable   Urine Protein not completed   General no apparent distress   Mental Status clear   Chest clear   Heart normal sinus rhythm   Abdomen gravid, non-tender   Estimated Fetal Weight Small for gestational age   Back no CVAT   Edema no edema   Reflexes 1+   Clonus positive   Pelvic no external lesions   Mebranes Intact   FHT Description cat 1 strictly reactive and no decels   Ucx absent   Skin dry   Lymph no lymphadenopathy   Impression:  Impression head trauma   Plan:  Plan monitor contractions and for cervical change    Follow Up Appointment need to schedule. in 1 week   Electronic Signatures: Adria DevonKlett, [name] (MD)  (Signed 18-Dec-14 22:30)  Authored: L&D Evaluation   Last Updated: 18-Dec-14 22:30 by Adria DevonKlett, Douglas Rooks (MD)

## 2015-05-03 NOTE — H&P (Signed)
L&D Evaluation:  History:  HPI Pt is a [age] yo G5P2022 at 34.[redacted] weeks GA who presents to L&D with reports of bleeding and pain x1 on the left side of her abdomen which she admits might be due to the baby moving. She c/o frequency and Her prenatal care is significant for c/s x2, trichomoniasis in pregnancy, and obesity. She is B+, VI, RI. She recieved her flu and TDaP vaccine this pregnancy.   Presents with bleeding   Patient's Medical History No Chronic Illness   Patient's Surgical History Colecystectomy  Previous C-Section   Medications Pre Natal Vitamins   Allergies NKDA   Social History none   Family History Non-Contributory   ROS:  ROS All systems were reviewed.  HEENT, CNS, GI, GU, Respiratory, CV, Renal and Musculoskeletal systems were found to be normal.   Exam:  Vital Signs stable   General no apparent distress   Mental Status clear   Chest clear   Heart normal sinus rhythm   Abdomen gravid, non-tender   Pelvic no external lesions, cervix closed and thick, no blood present on RN's glove with exam   Mebranes Intact   FHT normal rate with no decels, 130's, + accels   Ucx absent   Skin dry, no lesions, no rashes   Lymph no lymphadenopathy   Impression:  Impression IUP at 34.6, r/o UTI   Plan:  Plan UA, EFM/NST, monitor contractions and for cervical change   Follow Up Appointment already scheduled   Electronic Signatures: Jannet MantisSubudhi, Sevrin Sally (CNM)  (Signed [phone]-Nov-14 22:08)  Authored: L&D Evaluation   Last Updated: 26-Nov-14 22:08 by Jannet MantisSubudhi, Tika Hannis (CNM)

## 2016-06-15 ENCOUNTER — Emergency Department: Payer: Self-pay

## 2016-06-15 ENCOUNTER — Emergency Department
Admission: EM | Admit: 2016-06-15 | Discharge: 2016-06-15 | Disposition: A | Discharge status: Home / Self Care | Payer: Self-pay | Attending: Emergency Medicine | Admitting: Emergency Medicine

## 2016-06-15 ENCOUNTER — Encounter: Payer: Self-pay | Admitting: Medical Oncology

## 2016-06-15 DIAGNOSIS — G4489 Other headache syndrome: Secondary | ICD-10-CM | POA: Insufficient documentation

## 2016-06-15 DIAGNOSIS — Z79899 Other long term (current) drug therapy: Secondary | ICD-10-CM | POA: Insufficient documentation

## 2016-06-15 LAB — BASIC METABOLIC PANEL
Anion gap: 6 (ref 5–15)
BUN: 8 mg/dL (ref 6–20)
CHLORIDE: 108 mmol/L (ref 101–111)
CO2: 25 mmol/L (ref 22–32)
CREATININE: 0.8 mg/dL (ref 0.44–1.00)
Calcium: 8.9 mg/dL (ref 8.9–10.3)
GFR calc Af Amer: >60 mL/min (ref >60)
Glucose, Bld: 90 mg/dL (ref 65–99)
POTASSIUM: 3.9 mmol/L (ref 3.5–5.1)
SODIUM: 139 mmol/L (ref 135–145)

## 2016-06-15 LAB — URINALYSIS COMPLETE WITH MICROSCOPIC (ARMC ONLY)
BILIRUBIN URINE: NEGATIVE
GLUCOSE, UA: NEGATIVE mg/dL
Hgb urine dipstick: NEGATIVE
Leukocytes, UA: NEGATIVE
Nitrite: NEGATIVE
Protein, ur: 30 mg/dL — AB
Specific Gravity, Urine: 1.036 — ABNORMAL HIGH (ref 1.005–1.030)
pH: 5 (ref 5.0–8.0)

## 2016-06-15 LAB — CBC WITH DIFFERENTIAL/PLATELET
Basophils Absolute: 0 10*3/uL (ref 0–0.1)
Basophils Relative: 1 %
Eosinophils Absolute: 0.2 10*3/uL (ref 0–0.7)
Eosinophils Relative: 2 %
HEMATOCRIT: 36.8 % (ref 35.0–47.0)
HEMOGLOBIN: 12.5 g/dL (ref 12.0–16.0)
LYMPHS ABS: 2.3 10*3/uL (ref 1.0–3.6)
LYMPHS PCT: 31 %
MCH: 29.4 pg (ref 26.0–34.0)
MCHC: 33.8 g/dL (ref 32.0–36.0)
MCV: 86.9 fL (ref 80.0–100.0)
MONOS PCT: 8 %
Monocytes Absolute: 0.6 10*3/uL (ref 0.2–0.9)
NEUTROS ABS: 4.4 10*3/uL (ref 1.4–6.5)
NEUTROS PCT: 58 %
Platelets: 368 10*3/uL (ref 150–440)
RBC: 4.24 MIL/uL (ref 3.80–5.20)
RDW: 13.3 % (ref 11.5–14.5)
WBC: 7.5 10*3/uL (ref 3.6–11.0)

## 2016-06-15 LAB — POCT PREGNANCY, URINE: Preg Test, Ur: NEGATIVE

## 2016-06-15 MED ORDER — KETOROLAC TROMETHAMINE 30 MG/ML IJ SOLN
30.0000 mg | Freq: Once | INTRAMUSCULAR | Status: AC
  Administered 2016-06-15: 30 mg via INTRAVENOUS
  Filled 2016-06-15: qty 1

## 2016-06-15 MED ORDER — SODIUM CHLORIDE 0.9 % IV BOLUS (SEPSIS)
1000.0000 mL | Freq: Once | INTRAVENOUS | Status: AC
  Administered 2016-06-15: 1000 mL via INTRAVENOUS

## 2016-06-15 MED ORDER — SUMATRIPTAN SUCCINATE 50 MG PO TABS: 50.0000 mg | ORAL_TABLET | Freq: Once | ORAL | Status: AC | PRN

## 2016-06-15 MED ORDER — PROCHLORPERAZINE EDISYLATE 5 MG/ML IJ SOLN
10.0000 mg | Freq: Once | INTRAMUSCULAR | Status: AC
  Administered 2016-06-15: 10 mg via INTRAVENOUS
  Filled 2016-06-15: qty 2

## 2016-06-15 NOTE — ED Notes (Signed)
Headache X 2 weeks; pt has been medication with OTC medicines which provide relief for approx 30 minutes before pain is back. Pt tearful, states she is tired of pain. Nausea, dry heaves not present at this time but pt does report.

## 2016-06-15 NOTE — ED Notes (Signed)
Pt reports headaches off and on x 2 weeks with nausea and visual changes at times.

## 2016-06-15 NOTE — ED Notes (Signed)
Pt able to eat and drink fluids without emesis.

## 2016-06-15 NOTE — Discharge Instructions (Signed)

## 2016-06-15 NOTE — ED Provider Notes (Addendum)
[facility] Emergency Department Provider Note  ____________________________________________   I have reviewed the triage vital signs and the nursing notes.   HISTORY  Chief Complaint Headache    HPI Erika Wright is a 25 y.o. female is largely healthy. Patient states that she has had headaches for the last several years, worsening in intensity and frequency over the last year. She gets them once a week or so. Usually go away with over-the-counter medications sometimes they don't. They're usually on the right side of her head, sometimes she gets an aura or spots which are poorly described by her. She states that they're usually associated with nausea and sometimes vomiting. She has had no imaging of her head before for this. She did talk to her OB/GYN about it 2 or 3 months ago and they told her she needed to follow up with her primary care doctor but she does not have one and has not yet done so. Patient has no family or personal history of aneurysm or other significant cranial headache issue. She has not fallen or hit her head. She denies any focal numbness or weakness. She states the headache right now is a 6 or 7 out of 10. He has been coming and going for the last 2 weeks. She just finished her menstrual period, although normally she does not have an association with her headaches and her menses. The patient denies any fever or chills or stiff neck. Headache began gradually, goes away when she takes nonsteroidal pain medication and then comes back sometime afterwards. She has not had any change in vision or hearing. However she does have photophobia and sound does make her headache worse. She has no known exposure to carbon monoxide, or internal combustion, and she has not had any tick bites.       Past Medical History  Diagnosis Date  . Heart valve problem     Leaking    Patient Active Problem List   Diagnosis Date Noted  . Acute respiratory failure  (HCC) 01/06/2014  . Hemorrhagic shock 01/05/2014  . Retroperitoneal bleed 01/05/2014  . Hemorrhage, delayed postpartum 01/05/2014  . Heart valve problem     Past Surgical History  Procedure Laterality Date  . Cholecystectomy    . Cesarean section      x 3  . Exploratory laparotomy  2015    Current Outpatient Rx  Name  Route  Sig  Dispense  Refill  . docusate sodium 100 MG CAPS   Oral   Take 100 mg by mouth 2 (two) times daily as needed for mild constipation.   60 capsule   0   . ferrous sulfate 325 (65 FE) MG tablet   Oral   Take 1 tablet (325 mg total) by mouth 3 (three) times daily with meals.   90 tablet   0   . ibuprofen (ADVIL,MOTRIN) 200 MG tablet   Oral   Take 3 tablets (600 mg total) by mouth every 6 (six) hours as needed for moderate pain.   30 tablet   0   . oxyCODONE (OXY IR/ROXICODONE) 5 MG immediate release tablet   Oral   Take 1 tablet (5 mg total) by mouth every 4 (four) hours as needed for severe pain.   30 tablet   0     Allergies Review of patient's allergies indicates no known allergies.  No family history on file.  Social History Social History  Substance Use Topics  . Smoking status: Never  Smoker   . Smokeless tobacco: None  . Alcohol Use: No    Review of Systems Constitutional: No fever/chills Eyes: No visual changes. ENT: No sore throat. No stiff neck no neck pain Cardiovascular: Denies chest pain. Respiratory: Denies shortness of breath. Gastrointestinal:  Has vomited with this headache but not today.  No diarrhea.  No constipation. Genitourinary: Negative for dysuria. Musculoskeletal: Negative lower extremity swelling Skin: Negative for rash. Neurological: Positive for headaches, negative for focal weakness or numbness. 10-point ROS otherwise negative.  ____________________________________________   PHYSICAL EXAM:  VITAL SIGNS: ED Triage Vitals  Enc Vitals Group     BP 06/15/16 1412 122/57 mmHg     Pulse Rate  06/15/16 1412 69     Resp 06/15/16 1412 20     Temp 06/15/16 1412 98.7 F (37.1 C)     Temp Source 06/15/16 1412 Oral     SpO2 06/15/16 1412 98 %     Weight 06/15/16 1407 218 lb (98.884 kg)     Height --      Head Cir --      Peak Flow --      Pain Score 06/15/16 1407 9     Pain Loc --      Pain Edu? --      Excl. in GC? --     Constitutional: Alert and oriented. Well appearing and in no acute distress. Eyes: Conjunctivae are normal. PERRL. EOMI. Funduscopic exam, limited exam without dilation does not demonstrate any evidence of papilledema Head: Atraumatic. Nose: No congestion/rhinnorhea. Mouth/Throat: Mucous membranes are moist.  Oropharynx non-erythematous. Neck: No stridor.   Nontender with no meningismus Cardiovascular: Normal rate, regular rhythm. Grossly normal heart sounds.  Good peripheral circulation. Respiratory: Normal respiratory effort.  No retractions. Lungs CTAB. Abdominal: Soft and nontender. No distention. No guarding no rebound Back:  There is no focal tenderness or step off there is no midline tenderness there are no lesions noted. there is no CVA tenderness Musculoskeletal: No lower extremity tenderness. No joint effusions, no DVT signs strong distal pulses no edema Neurologic: Cranial nerves II through XII are grossly intact 5 out of 5 strength bilateral upper and lower extremity. Finger to nose within normal limits heel to shin within normal limits, speech is normal with no word finding difficulty or dysarthria, reflexes symmetric, pupils are equally round and reactive to light, there is no pronator drift, sensation is normal, vision is intact to confrontation, gait is deferred, there is no nystagmus, normal neurologic exam Skin:  Skin is warm, dry and intact. No rash noted. Psychiatric: Mood and affect are normal. Speech and behavior are normal.  ____________________________________________   LABS (all labs ordered are listed, but only abnormal results are  displayed)  Labs Reviewed  CBC WITH DIFFERENTIAL/PLATELET  BASIC METABOLIC PANEL  URINALYSIS COMPLETEWITH MICROSCOPIC ([facility] ONLY)  POC URINE PREG, ED  POCT PREGNANCY, URINE   ____________________________________________  EKG  I personally interpreted any EKGs ordered by me or triage  ____________________________________________  RADIOLOGY  I reviewed any imaging ordered by me or triage that were performed during my shift and, if possible, patient and/or family made aware of any abnormal findings. ____________________________________________   PROCEDURES  Procedure(s) performed: None  Critical Care performed: None  ____________________________________________   INITIAL IMPRESSION / ASSESSMENT AND PLAN / ED COURSE  Pertinent labs & imaging results that were available during my care of the patient were reviewed by me and considered in my medical decision making (see chart for details).  Patient  describes what is fairly classically appreciated as a migraine like symptom with a headache problem with her going on for many years getting worse over the last year. This is not the worst headache of life and was not sudden onset, low suspicion for therefore for meningitis or bleed. Do not believe she has cavernous thrombosis. One thing that may require imaging however would be a mass for chronic headaches and she has had no imaging we did do a CT scan. CT scan is negative. We will therefore treat the patient with anti-migrainous medications. I do not think LP is indicated and in fact I believe that the risk of harm to the patient outweighs likely benefit. Patient will require outpatient follow-up with neurology for her chronic headache. I have advised her that our goal was not to be headache free with try to get the headache better under control. I may consider sending her home with Imitrex or some sort of an abortive therapy if we can get her feeling  better.  ----------------------------------------- 5:02 PM on 06/15/2016 -----------------------------------------  Patient's headache is gone at this time, her vital signs are reassuring, her CT is negative blood work is normal urinalysis is reassuring,  neurologic exam remains normal, do not feel that at this time patient is likely to be suffering from any of the above entities noted in the differential nor is there any evidence at this time of temporal arteritis or pseudotumor, although further workup of her headache certainly would be indicated as an outpatient and I have stressed this to her, at this time there is no indication of any decompensating acute pathology and these headaches and been present now for years. ____________________________________________   FINAL CLINICAL IMPRESSION(S) / ED DIAGNOSES  Final diagnoses:  None      This chart was dictated using voice recognition software.  Despite best efforts to proofread,  errors can occur which can change meaning.     Jeanmarie PlantJames A Justin Buechner, MD 06/15/16 1622  Jeanmarie PlantJames A Jenette Rayson, MD 06/15/16 [id number]

## 2016-06-15 NOTE — ED Notes (Signed)
Pt given crackers and drink for PO challenge. Pt alert and oriented X4, active, cooperative, pt in NAD. RR even and unlabored, color WNL.

## 2016-06-15 NOTE — ED Notes (Signed)
Pt alert and oriented X4, active, cooperative, pt in NAD. RR even and unlabored, color WNL.  Pt informed to return if any life threatening symptoms occur.   

## 2018-05-30 IMAGING — CT CT HEAD W/O CM
3 series · 16 of 45 positions shown, 19 images · non-contrast
Comparison: None.

CLINICAL DATA: Intermittent headaches for 2 weeks

EXAM:
CT HEAD WITHOUT CONTRAST
TECHNIQUE: Contiguous axial images were obtained from the base of the skull
through the vertex without intravenous contrast.

[Series 2: head wo · axial · 0.41mm/px · z∈[+274,+388]mm · 10 of 28 slices shown, 13 images]
[im 3/28  brain]
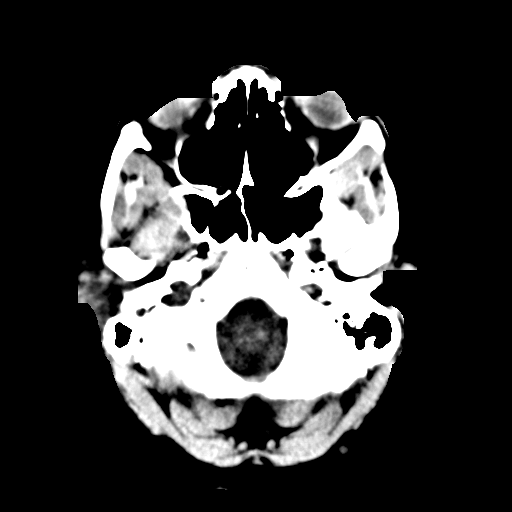
[im 3/28  bone]
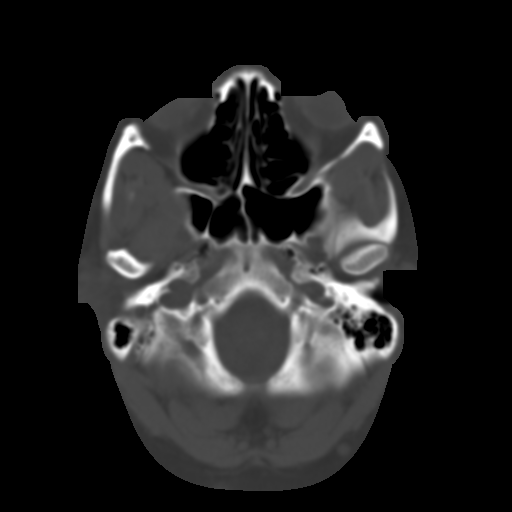
[im 5/28  brain]
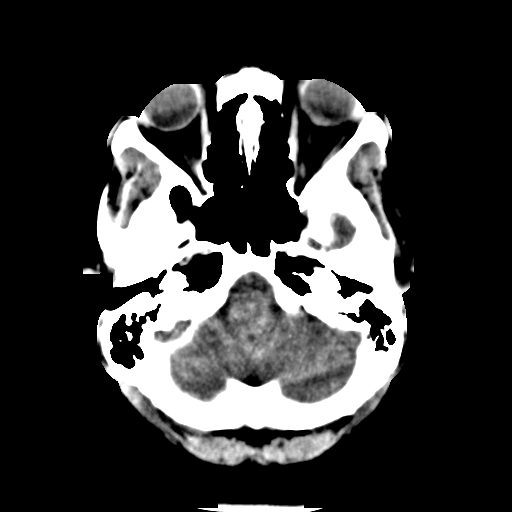
[im 8/28  brain]
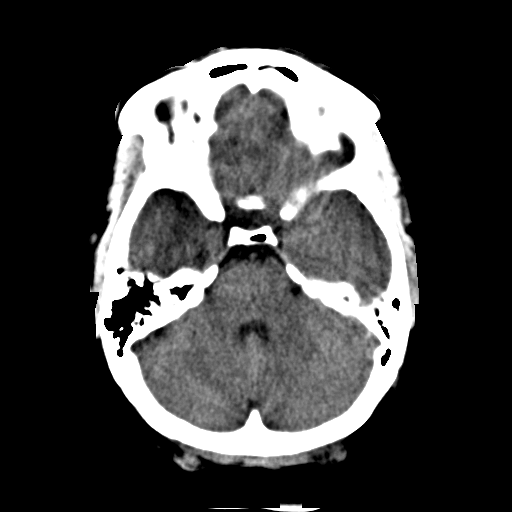
[im 11/28  brain]
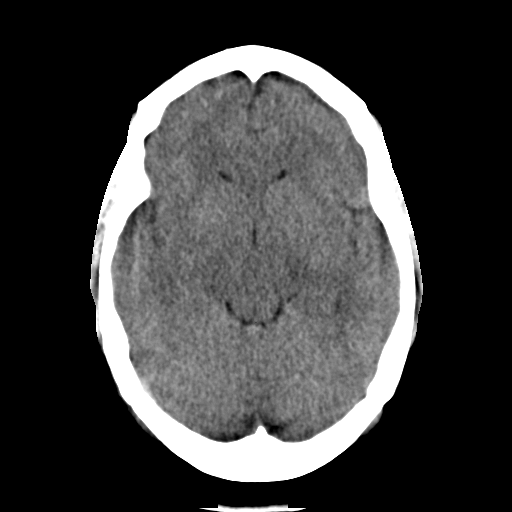
[im 13/28  brain]
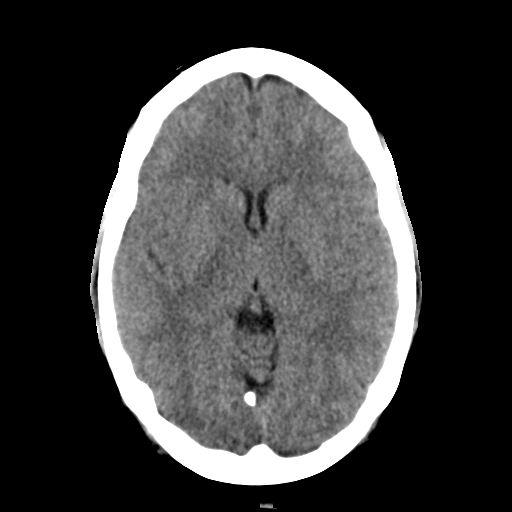
[im 13/28  bone]
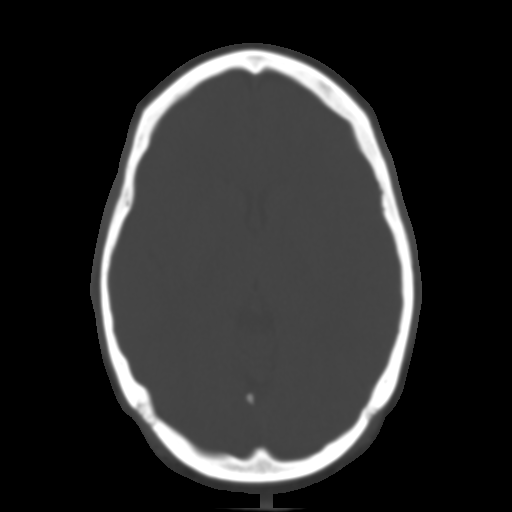
[im 16/28  brain]
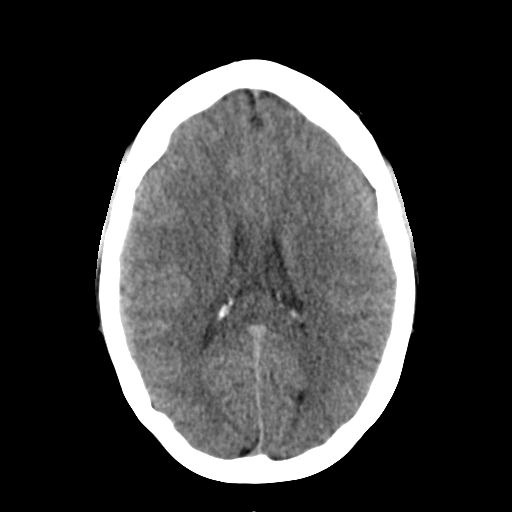
[im 18/28  brain]
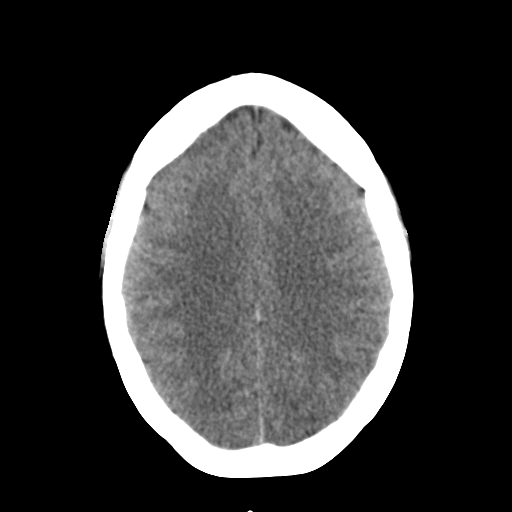
[im 21/28  brain]
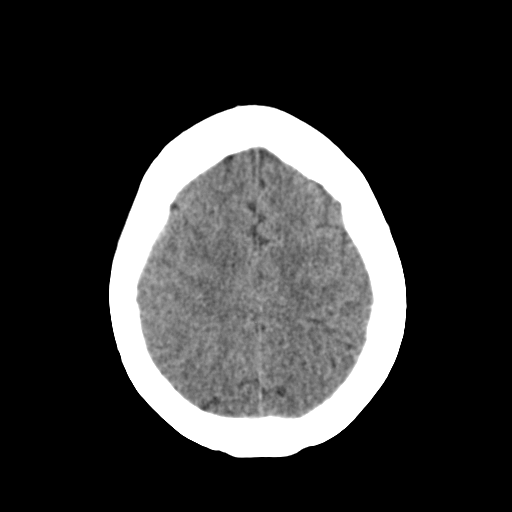
[im 24/28  brain]
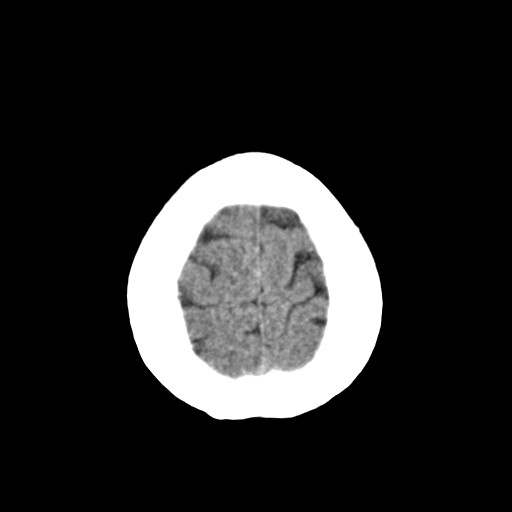
[im 24/28  bone]
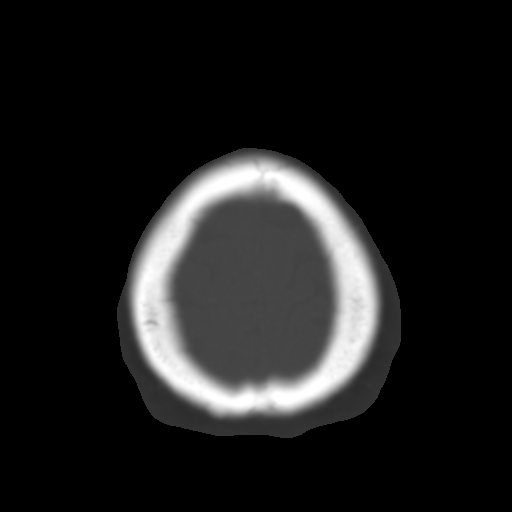
[im 26/28  brain]
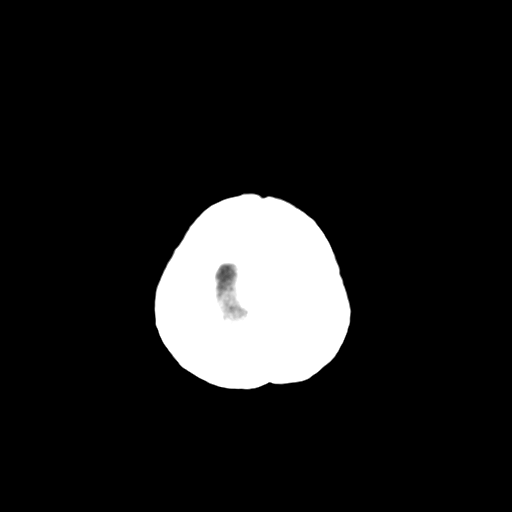

[Series 4: coronal soft · coronal · 0.29mm/px · 3 of 60 slices shown]
[im 20/60  brain]
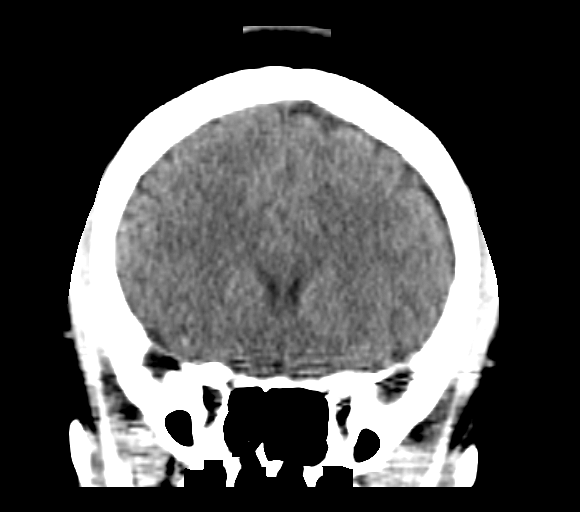
[im 27/60  brain]
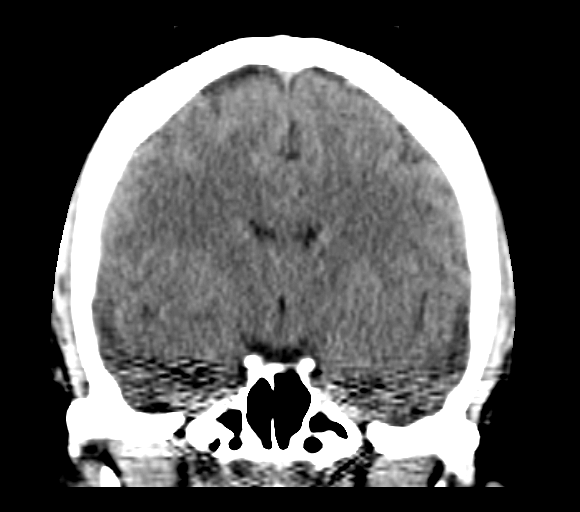
[im 33/60  brain]
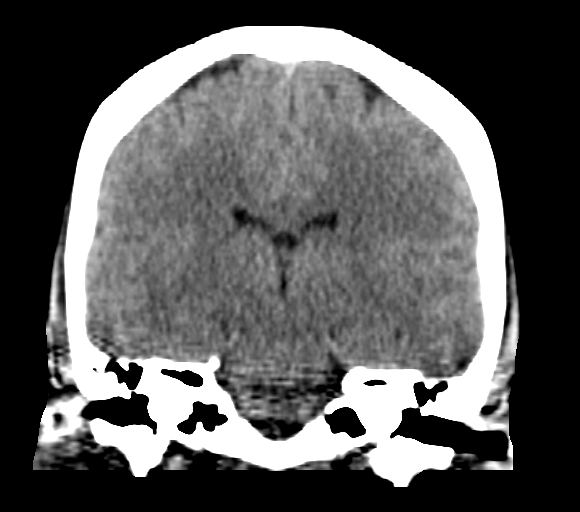

[Series 5: sagittal soft · sagittal · 0.30mm/px · 3 of 49 slices shown]
[im 17/49  brain]
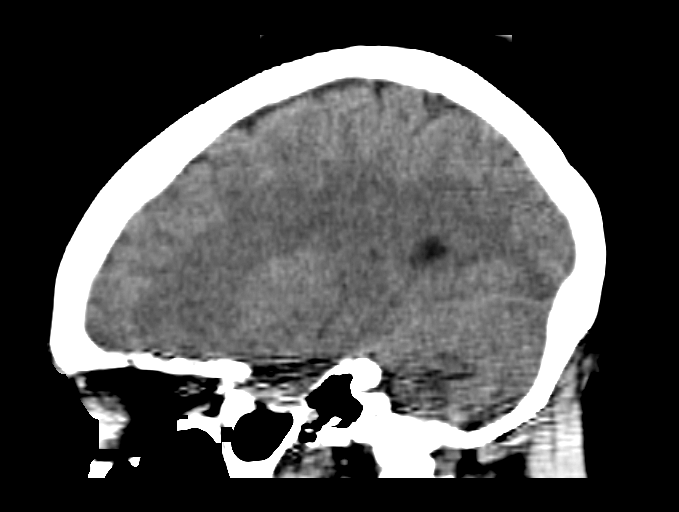
[im 25/49  brain]
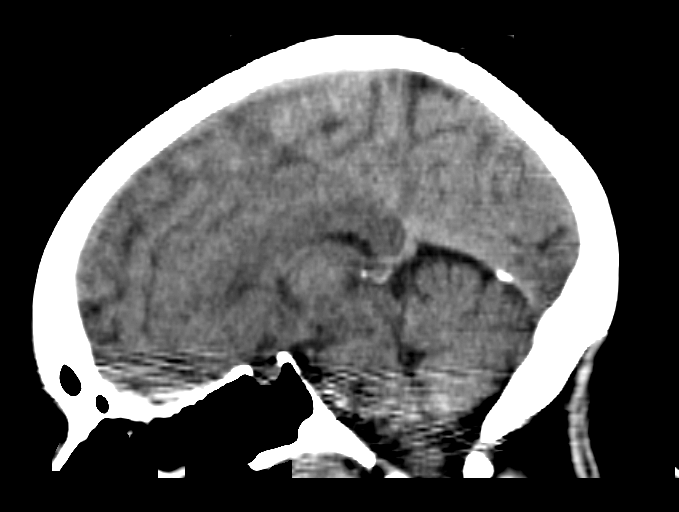
[im 33/49  brain]
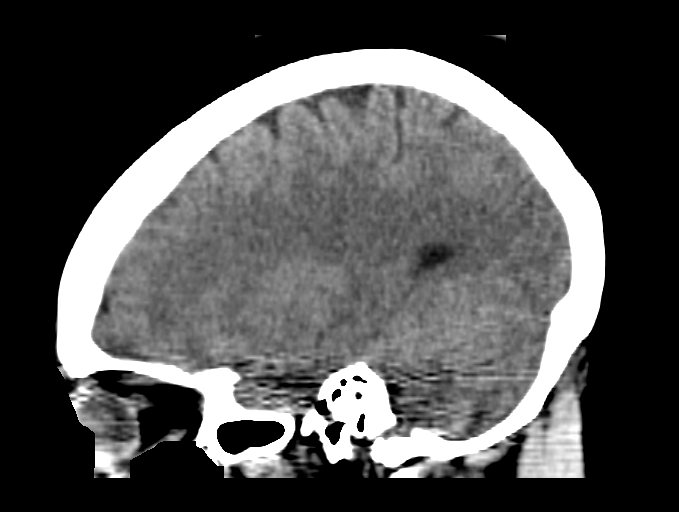

[16 of 45 positions shown; findings below may reference images not displayed]

FINDINGS: No mass lesion. No midline shift. No acute hemorrhage or hematoma.
No extra-axial fluid collections. No evidence of acute infarction.
Calvarium intact. Visualized portions of the paranasal sinuses
clear.
IMPRESSION: Normal head CT

## 2019-09-27 ENCOUNTER — Emergency Department: Payer: 59

## 2019-09-27 ENCOUNTER — Other Ambulatory Visit: Payer: Self-pay

## 2019-09-27 DIAGNOSIS — R0789 Other chest pain: Secondary | ICD-10-CM | POA: Insufficient documentation

## 2019-09-27 DIAGNOSIS — K219 Gastro-esophageal reflux disease without esophagitis: Secondary | ICD-10-CM | POA: Insufficient documentation

## 2019-09-27 DIAGNOSIS — Z79899 Other long term (current) drug therapy: Secondary | ICD-10-CM | POA: Insufficient documentation

## 2019-09-27 LAB — COMPREHENSIVE METABOLIC PANEL
ALT: 31 U/L (ref 0–44)
AST: 59 U/L — ABNORMAL HIGH (ref 15–41)
Albumin: 3.8 g/dL (ref 3.5–5.0)
Alkaline Phosphatase: 53 U/L (ref 38–126)
Anion gap: 7 (ref 5–15)
BUN: 7 mg/dL (ref 6–20)
CO2: 25 mmol/L (ref 22–32)
Calcium: 8.5 mg/dL — ABNORMAL LOW (ref 8.9–10.3)
Chloride: 105 mmol/L (ref 98–111)
Creatinine, Ser: 0.74 mg/dL (ref 0.44–1.00)
GFR calc Af Amer: >60 mL/min (ref >60)
GFR calc non Af Amer: >60 mL/min (ref >60)
Glucose, Bld: 101 mg/dL — ABNORMAL HIGH (ref 70–99)
Potassium: 3.9 mmol/L (ref 3.5–5.1)
Sodium: 137 mmol/L (ref 135–145)
Total Bilirubin: 0.6 mg/dL (ref 0.3–1.2)
Total Protein: 7.1 g/dL (ref 6.5–8.1)

## 2019-09-27 LAB — CBC
HCT: 37.7 % (ref 36.0–46.0)
Hemoglobin: 12.5 g/dL (ref 12.0–15.0)
MCH: 31 pg (ref 26.0–34.0)
MCHC: 33.2 g/dL (ref 30.0–36.0)
MCV: 93.5 fL (ref 80.0–100.0)
Platelets: 325 10*3/uL (ref 150–400)
RBC: 4.03 MIL/uL (ref 3.87–5.11)
RDW: 11.8 % (ref 11.5–15.5)
WBC: 7.8 10*3/uL (ref 4.0–10.5)
nRBC: 0 % (ref 0.0–0.2)

## 2019-09-27 LAB — TROPONIN I (HIGH SENSITIVITY): Troponin I (High Sensitivity): <2 ng/L (ref <18)

## 2019-09-27 NOTE — ED Triage Notes (Signed)
Pt states onset of chest pain while at rest tonight. Pt states pain is central and radiates under breasts. Pt states she did feel shob with pain. Pt denies fever, cough.

## 2019-09-28 ENCOUNTER — Emergency Department
Admission: EM | Admit: 2019-09-28 | Discharge: 2019-09-28 | Disposition: A | Discharge status: Home / Self Care | Payer: 59 | Attending: Emergency Medicine | Admitting: Emergency Medicine

## 2019-09-28 DIAGNOSIS — K219 Gastro-esophageal reflux disease without esophagitis: Secondary | ICD-10-CM

## 2019-09-28 DIAGNOSIS — R0789 Other chest pain: Secondary | ICD-10-CM

## 2019-09-28 LAB — LIPASE, BLOOD: Lipase: 31 U/L (ref 11–51)

## 2019-09-28 LAB — TROPONIN I (HIGH SENSITIVITY): Troponin I (High Sensitivity): 2 ng/L (ref <18)

## 2019-09-28 MED ORDER — FAMOTIDINE 40 MG PO TABS: 40.0000 mg | ORAL_TABLET | Freq: Every day | ORAL | 1 refills | Status: DC

## 2019-09-28 MED ORDER — ALUM & MAG HYDROXIDE-SIMETH 400-400-40 MG/5ML PO SUSP: 5.0000 mL | Freq: Four times a day (QID) | ORAL | 0 refills | Status: DC | PRN

## 2019-09-28 MED ORDER — LIDOCAINE VISCOUS HCL 2 % MT SOLN
15.0000 mL | Freq: Once | OROMUCOSAL | Status: AC
  Administered 2019-09-28: 01:00:00 15 mL via ORAL
  Filled 2019-09-28: qty 15

## 2019-09-28 MED ORDER — ALUM & MAG HYDROXIDE-SIMETH 200-200-20 MG/5ML PO SUSP
30.0000 mL | Freq: Once | ORAL | Status: AC
  Administered 2019-09-28: 01:00:00 30 mL via ORAL
  Filled 2019-09-28: qty 30

## 2019-09-28 MED ORDER — FAMOTIDINE 40 MG PO TABS: 40.0000 mg | ORAL_TABLET | Freq: Every day | ORAL | 1 refills | Status: AC

## 2019-09-28 NOTE — ED Provider Notes (Signed)
[facility] Emergency Department Provider Note  ____________________________________________  Time seen: Approximately 12:51 AM  I have reviewed the triage vital signs and the nursing notes.   HISTORY  Chief Complaint Chest Pain   HPI Erika HumbleShanekia S Wright is a 28 y.o. female who presents for evaluation of chest pain.  Patient reports that she has had intermittent chest pain for the last 5 to 7 days.  She attributed to a stressful job that she has.  She reports that she quit her job 2 days ago.  She reports that the pain is a pressure located in the center lower part of her chest, usually lasts 5 minutes and resolves without intervention.  This evening the pain has been constant for several hours.  The pain is 8 out of 10.  She denies shortness of breath but reports that it is slightly more difficult to take a deep breath since the pain started.  She is a smoker.  No history of COPD or asthma.  No personal or family history of heart disease or blood clots, recent travel immobilization, leg pain or swelling, hemoptysis, or exogenous hormones.   No cough, no fever, no vomiting, no diarrhea.  The pain is not worse postprandially.  The pain happens both at rest and with exertion.  Past Medical History:  Diagnosis Date  . Heart valve problem    Leaking    Patient Active Problem List   Diagnosis Date Noted  . Acute respiratory failure (HCC) 01/06/2014  . Hemorrhagic shock (HCC) 01/05/2014  . Retroperitoneal bleed 01/05/2014  . Hemorrhage, delayed postpartum 01/05/2014  . Heart valve problem     Past Surgical History:  Procedure Laterality Date  . CESAREAN SECTION     x 3  . CHOLECYSTECTOMY    . EXPLORATORY LAPAROTOMY  2015    Prior to Admission medications   Medication Sig Start Date End Date Taking? Authorizing Provider  alum & mag hydroxide-simeth (MAALOX MAX) 400-400-40 MG/5ML suspension Take 5 mLs by mouth every 6 (six) hours as needed for indigestion.  09/28/19   Nita SickleVeronese, [place], MD  docusate sodium 100 MG CAPS Take 100 mg by mouth 2 (two) times daily as needed for mild constipation. 01/09/14   Vale HavenBeck, Keli L, MD  famotidine (PEPCID) 40 MG tablet Take 1 tablet (40 mg total) by mouth at bedtime. 09/28/19 09/27/20  Nita SickleVeronese, [place], MD  ferrous sulfate 325 (65 FE) MG tablet Take 1 tablet (325 mg total) by mouth 3 (three) times daily with meals. 01/09/14   Vale HavenBeck, Keli L, MD  ibuprofen (ADVIL,MOTRIN) 200 MG tablet Take 3 tablets (600 mg total) by mouth every 6 (six) hours as needed for moderate pain. 01/09/14   Vale HavenBeck, Keli L, MD  oxyCODONE (OXY IR/ROXICODONE) 5 MG immediate release tablet Take 1 tablet (5 mg total) by mouth every 4 (four) hours as needed for severe pain. 01/09/14   Vale HavenBeck, Keli L, MD  SUMAtriptan (IMITREX) 50 MG tablet Take 1 tablet (50 mg total) by mouth once as needed for migraine. May repeat in 2 hours if headache persists or recurs. 06/15/16 06/15/17  Jeanmarie PlantMcShane, James A, MD    Allergies Patient has no known allergies.  No family history on file.  Social History Social History   Tobacco Use  . Smoking status: Never Smoker  Substance Use Topics  . Alcohol use: No  . Drug use: Not on file    Review of Systems  Constitutional: Negative for fever. Eyes: Negative for visual changes. ENT:  Negative for sore throat. Neck: No neck pain  Cardiovascular: + chest pain. Respiratory: Negative for shortness of breath. Gastrointestinal: Negative for abdominal pain, vomiting or diarrhea. Genitourinary: Negative for dysuria. Musculoskeletal: Negative for back pain. Skin: Negative for rash. Neurological: Negative for headaches, weakness or numbness. Psych: No SI or HI  ____________________________________________   PHYSICAL EXAM:  VITAL SIGNS: ED Triage Vitals  Enc Vitals Group     BP 09/27/19 2156 (!) 125/52     Pulse Rate 09/27/19 2156 60     Resp 09/27/19 2156 18     Temp 09/27/19 2156 97.7 F (36.5 C)     Temp Source  09/27/19 2156 Oral     SpO2 09/27/19 2156 100 %     Weight 09/27/19 2157 190 lb (86.2 kg)     Height 09/27/19 2157 5\' 2"  (1.575 m)     Head Circumference --      Peak Flow --      Pain Score 09/27/19 2157 6     Pain Loc --      Pain Edu? --      Excl. in [place]? --     Constitutional: Alert and oriented. Well appearing and in no apparent distress. HEENT:      Head: Normocephalic and atraumatic.         Eyes: Conjunctivae are normal. Sclera is non-icteric.       Mouth/Throat: Mucous membranes are moist.       Neck: Supple with no signs of meningismus. Cardiovascular: Regular rate and rhythm. No murmurs, gallops, or rubs. 2+ symmetrical distal pulses are present in all extremities. No JVD.  Palpation of the chest wall reproduces the pain Respiratory: Normal respiratory effort. Lungs are clear to auscultation bilaterally. No wheezes, crackles, or rhonchi.  Gastrointestinal: Soft, mild epigastric tenderness with no right upper quadrant or left upper quadrant tenderness, and non distended with positive bowel sounds. No rebound or guarding. Genitourinary: No CVA tenderness. Musculoskeletal: Nontender with normal range of motion in all extremities. No edema, cyanosis, or erythema of extremities. Neurologic: Normal speech and language. Face is symmetric. Moving all extremities. No gross focal neurologic deficits are appreciated. Skin: Skin is warm, dry and intact. No rash noted. Psychiatric: Mood and affect are normal. Speech and behavior are normal.  ____________________________________________   LABS (all labs ordered are listed, but only abnormal results are displayed)  Labs Reviewed  COMPREHENSIVE METABOLIC PANEL - Abnormal; Notable for the following components:      Result Value   Glucose, Bld 101 (*)    Calcium 8.5 (*)    AST 59 (*)    All other components within normal limits  CBC  LIPASE, BLOOD  POC URINE PREG, ED  TROPONIN I (HIGH SENSITIVITY)  TROPONIN I (HIGH SENSITIVITY)    ____________________________________________  EKG  ED ECG REPORT I, Rudene Re, the attending physician, personally viewed and interpreted this ECG.  Normal sinus rhythm, rate of 60, normal intervals, normal axis, no ST elevations or depressions.  Normal EKG. ____________________________________________  RADIOLOGY  I have personally reviewed the images performed during this visit and I agree with the Radiologist's read.   Interpretation by Radiologist:  Dg Chest 2 View  Result Date: 09/27/2019 CLINICAL DATA:  Chest pain EXAM: CHEST - 2 VIEW COMPARISON:  January 06, 2014 FINDINGS: The heart size and mediastinal contours are within normal limits. Both lungs are clear. The visualized skeletal structures are unremarkable. IMPRESSION: No acute cardiopulmonary process. Electronically Signed   By: Prudencio Pair  M.D.   On: 09/27/2019 22:27     ____________________________________________   PROCEDURES  Procedure(s) performed: None Procedures Critical Care performed:  None ____________________________________________   INITIAL IMPRESSION / ASSESSMENT AND PLAN / ED COURSE   28 y.o. female who presents for evaluation of intermittent chest pressure.  Patient is extremely well-appearing in no distress with normal vital signs, normal work of breathing, normal sats, lungs CTAB. Pain is reproducible on palpation of the lower central chest and epigastrium.  Ddx MSK, costochondritis, GERD, pancreatitis, GB disease, PUD, gastritis.  Patient is PERC negative.  Low suspicion for ACS given history, nonischemic EKG, negative troponin x2.  Chest x-ray negative for pneumonia or pneumothorax. Will give GI cocktail and reassess     _________________________ 2:01 AM on 09/28/2019 -----------------------------------------  After GI cocktail pain fully resolved. Will dc on pepcid QHS, maalox PRN and f/u with PCP.  Discussed my standard return precautions and close follow-up with PCP.    As  part of my medical decision making, I reviewed the following data within the electronic MEDICAL RECORD NUMBER Nursing notes reviewed and incorporated, Labs reviewed , EKG interpreted , Old EKG reviewed, Old chart reviewed, Radiograph reviewed , Notes from prior ED visits and [place] Controlled Substance Database   Patient was evaluated in Emergency Department today for the symptoms described in the history of present illness. Patient was evaluated in the context of the global COVID-19 pandemic, which necessitated consideration that the patient might be at risk for infection with the SARS-CoV-2 virus that causes COVID-19. Institutional protocols and algorithms that pertain to the evaluation of patients at risk for COVID-19 are in a state of rapid change based on information released by regulatory bodies including the CDC and federal and state organizations. These policies and algorithms were followed during the patient's care in the ED.   ____________________________________________   FINAL CLINICAL IMPRESSION(S) / ED DIAGNOSES   Final diagnoses:  Atypical chest pain  Gastroesophageal reflux disease, unspecified whether esophagitis present      NEW MEDICATIONS STARTED DURING THIS VISIT:  ED Discharge Orders         Ordered    famotidine (PEPCID) 40 MG tablet  Daily at bedtime     09/28/19 0201    alum & mag hydroxide-simeth (MAALOX MAX) 400-400-40 MG/5ML suspension  Every 6 hours PRN     09/28/19 0201           Note:  This document was prepared using Dragon voice recognition software and may include unintentional dictation errors.    Nita Sickle, MD 09/28/19 [phone]

## 2020-03-07 ENCOUNTER — Other Ambulatory Visit: Payer: Self-pay

## 2021-09-10 IMAGING — CR DG CHEST 2V
1 series · 2 of 2 positions shown · non-contrast
Comparison: January 06, 2014

CLINICAL DATA: Chest pain

EXAM:
CHEST - 2 VIEW

[Series 1: dg chest 2 view · 0.14mm/px · 2 of 2 slices shown]
[im 1/2]
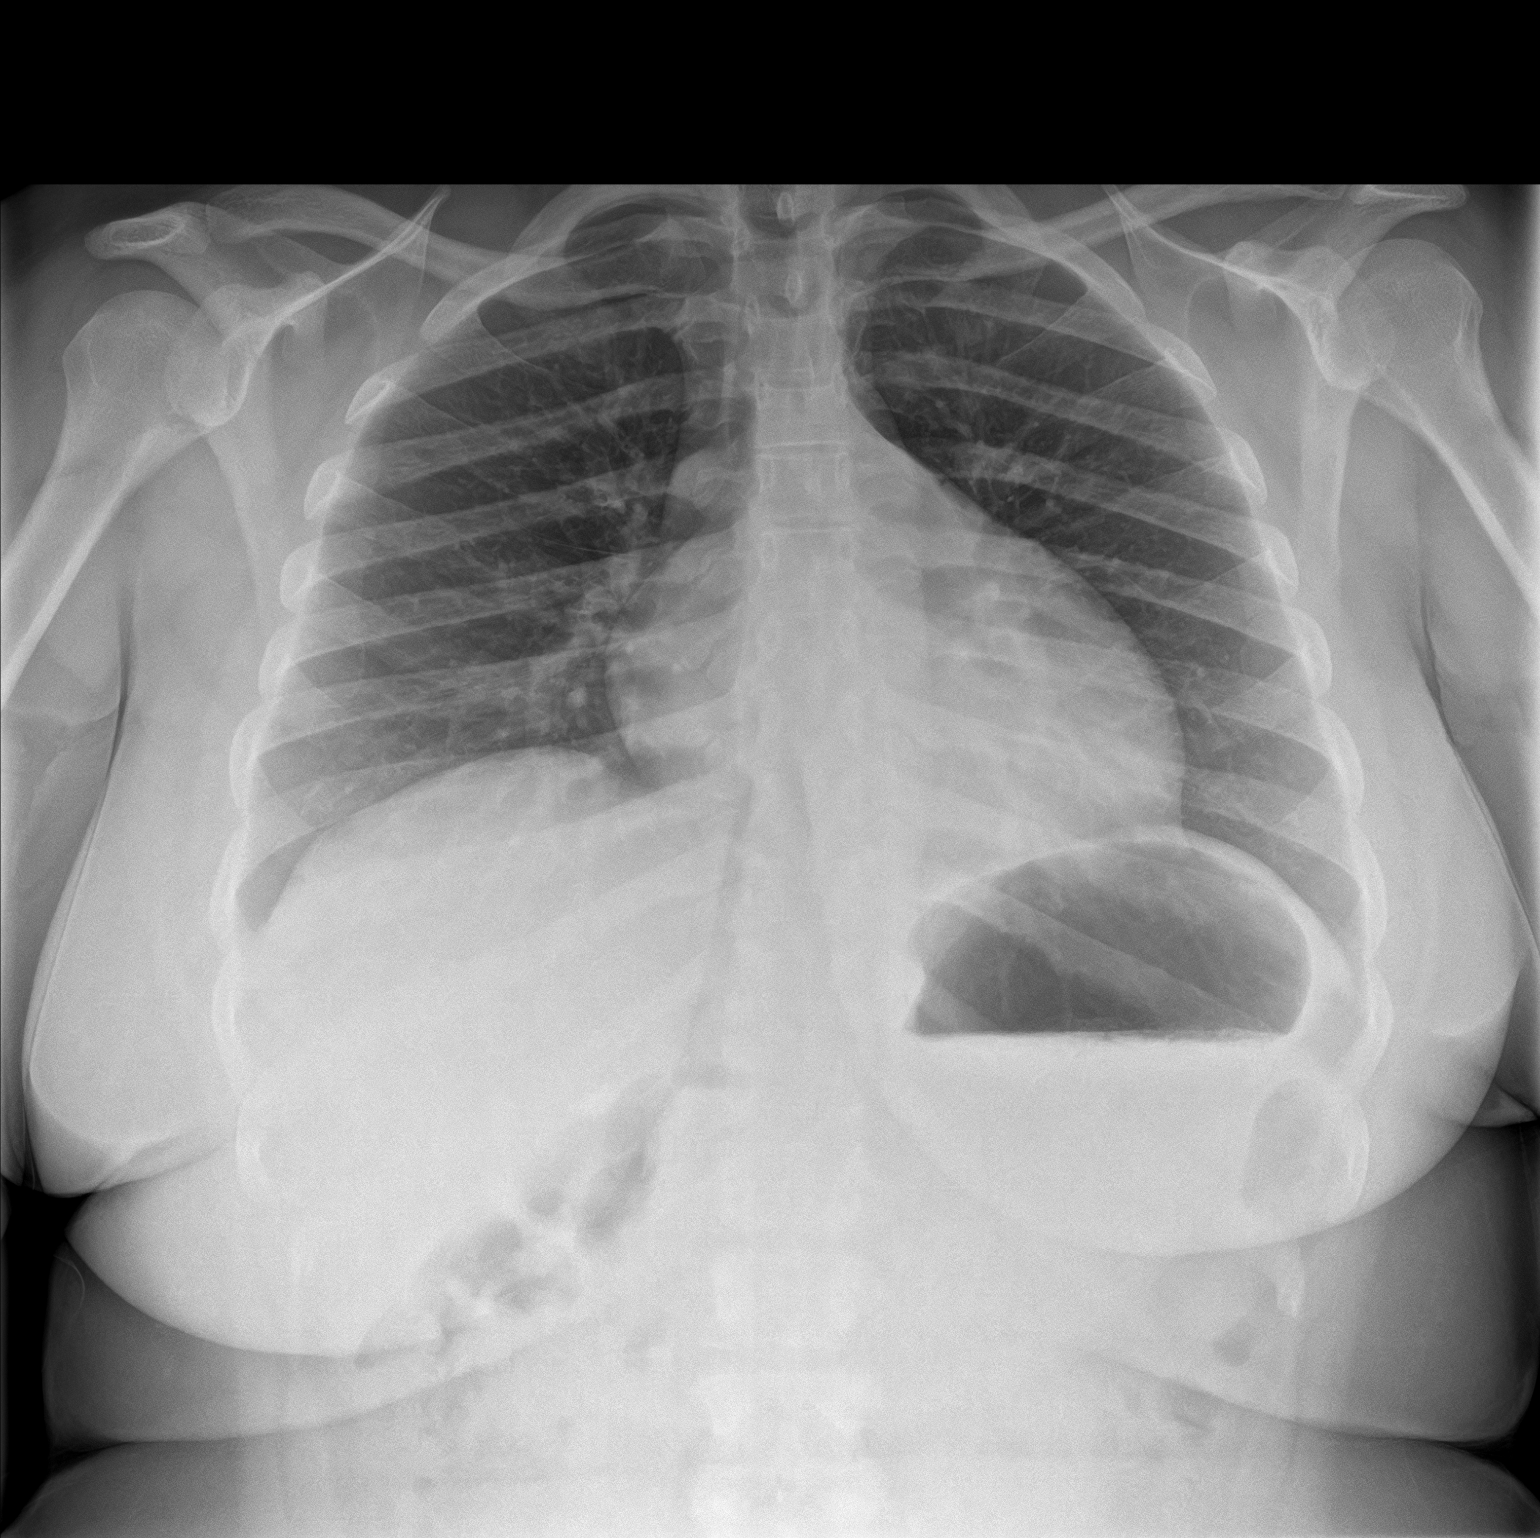
[im 2/2]
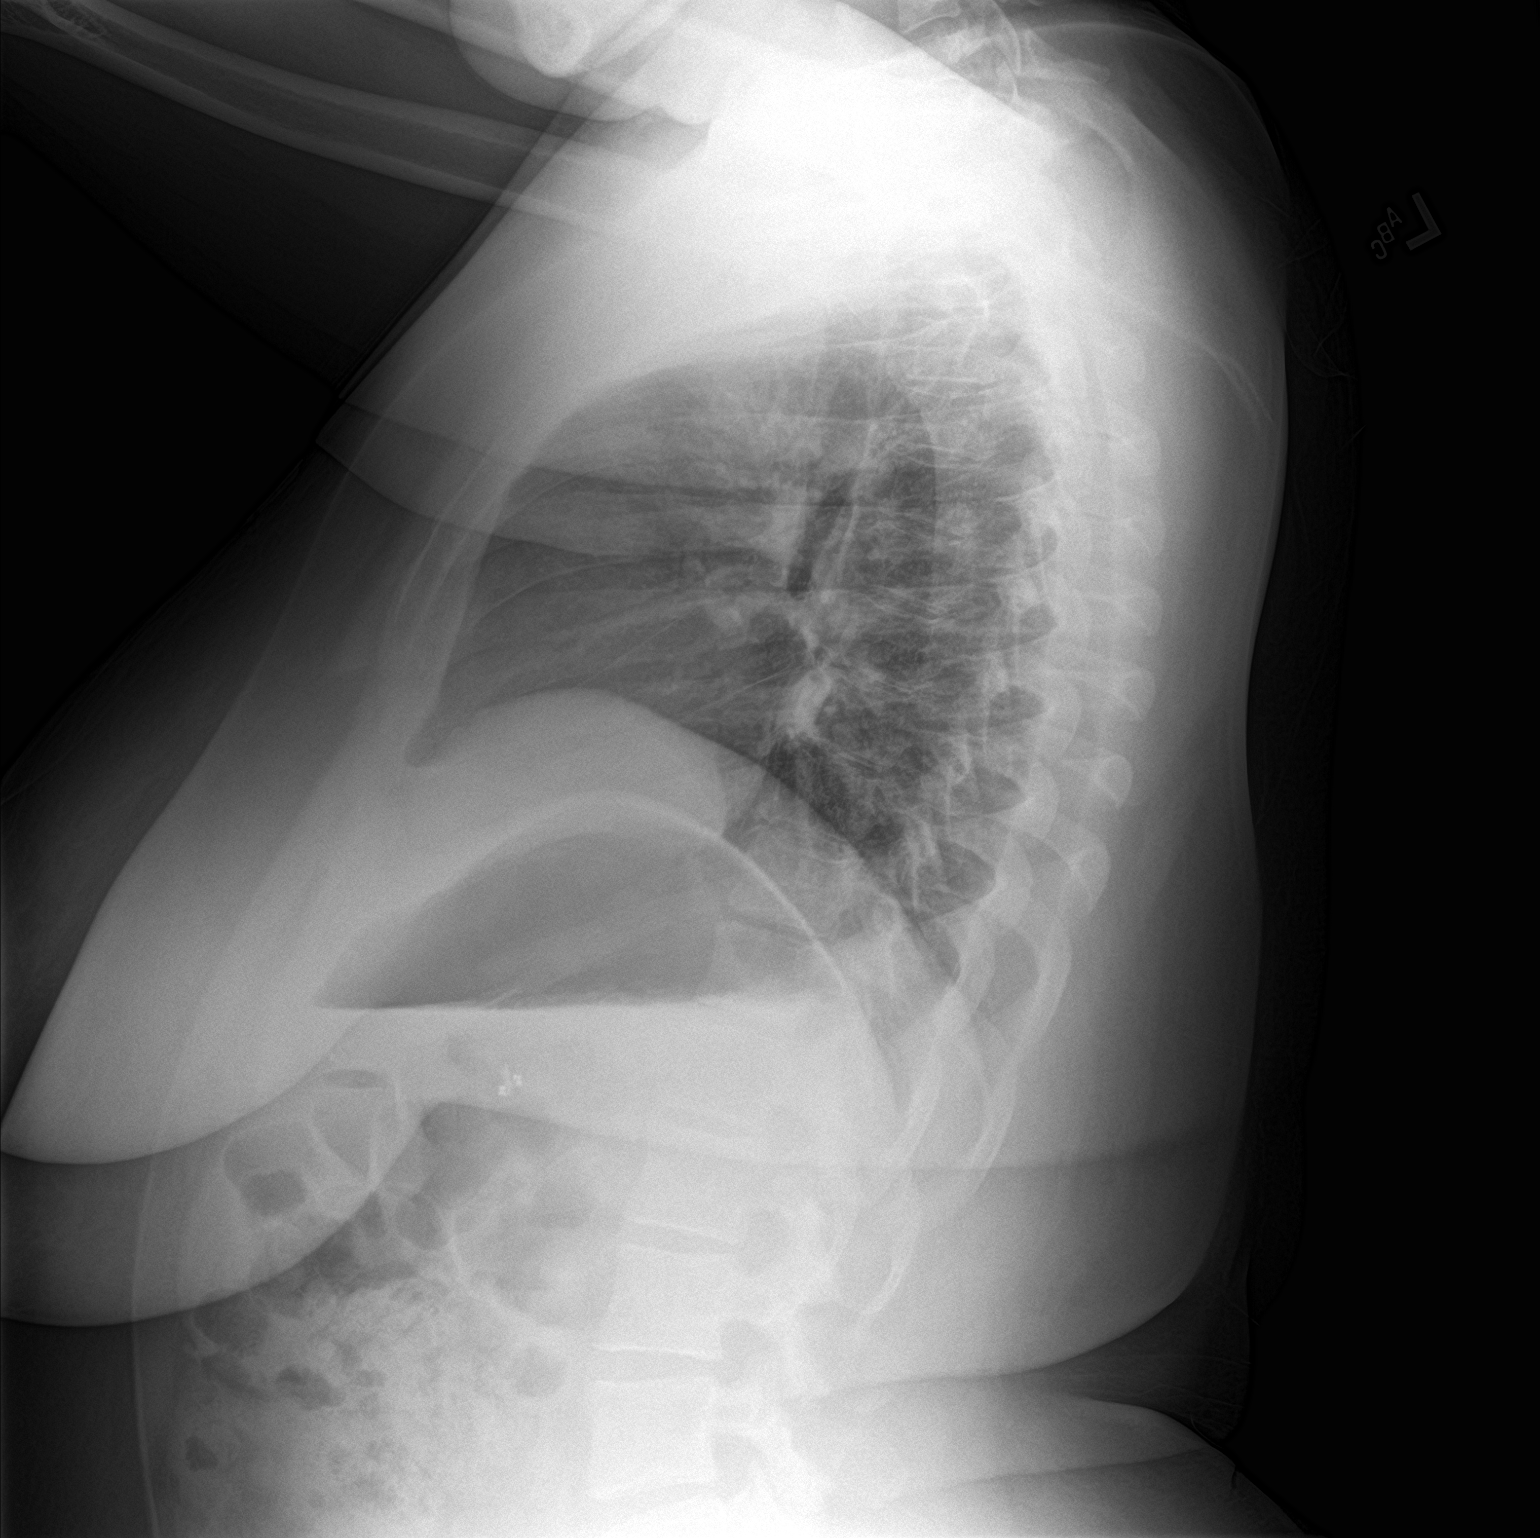

[2 of 2 positions shown; findings below may reference images not displayed]

FINDINGS: The heart size and mediastinal contours are within normal limits.
Both lungs are clear. The visualized skeletal structures are
unremarkable.
IMPRESSION: No acute cardiopulmonary process.

## 2023-06-06 ENCOUNTER — Other Ambulatory Visit: Payer: Self-pay

## 2023-06-06 ENCOUNTER — Emergency Department
Admission: EM | Admit: 2023-06-06 | Discharge: 2023-06-07 | Disposition: A | Discharge status: Home / Self Care | Payer: Medicaid Other | Attending: Emergency Medicine | Admitting: Emergency Medicine

## 2023-06-06 DIAGNOSIS — X58XXXA Exposure to other specified factors, initial encounter: Secondary | ICD-10-CM | POA: Diagnosis not present

## 2023-06-06 DIAGNOSIS — S0501XA Injury of conjunctiva and corneal abrasion without foreign body, right eye, initial encounter: Secondary | ICD-10-CM | POA: Insufficient documentation

## 2023-06-06 DIAGNOSIS — S058X1A Other injuries of right eye and orbit, initial encounter: Secondary | ICD-10-CM | POA: Diagnosis present

## 2023-06-06 MED ORDER — CIPROFLOXACIN HCL 0.3 % OP SOLN
2.0000 [drp] | OPHTHALMIC | Status: DC
  Administered 2023-06-06: 2 [drp] via OPHTHALMIC
  Filled 2023-06-06: qty 2.5

## 2023-06-06 MED ORDER — FLUORESCEIN SODIUM 1 MG OP STRP
1.0000 | ORAL_STRIP | Freq: Once | OPHTHALMIC | Status: AC
  Administered 2023-06-06: 1 via OPHTHALMIC
  Filled 2023-06-06: qty 1

## 2023-06-06 MED ORDER — CIPROFLOXACIN HCL 0.3 % OP SOLN: 1.0000 [drp] | OPHTHALMIC | 0 refills | Status: AC

## 2023-06-06 MED ORDER — TETRACAINE HCL 0.5 % OP SOLN
2.0000 [drp] | Freq: Once | OPHTHALMIC | Status: AC
  Administered 2023-06-06: 2 [drp] via OPHTHALMIC
  Filled 2023-06-06: qty 4

## 2023-06-06 MED ORDER — KETOROLAC TROMETHAMINE 0.5 % OP SOLN: 1.0000 [drp] | Freq: Four times a day (QID) | OPHTHALMIC | 0 refills | Status: AC

## 2023-06-06 NOTE — Discharge Instructions (Addendum)
Holding onto the antibiotic prescription which is the ciprofloxacin prescription unless you lose the bottle or run out.  Go ahead and fill the Acular prescription and use this 1 in addition to the antibiotics.  Space these L 30 minutes between drops.  After that he may use as much normal saline or Visine as you like.

## 2023-06-06 NOTE — ED Triage Notes (Signed)
Pt to ED via POV c/o eye swelling and irritation. Pt says she got pollen in her eyes, which has irritated them. Pt right eye red and tearful, pt was trying to take contact out but that made her eye more irritated. Pt denies any visual changes.

## 2023-06-06 NOTE — ED Provider Notes (Signed)
[facility] Provider Note  Patient Contact: 11:16 PM (approximate)   History   Eye Problem   HPI  Erika Wright is a 32 y.o. female who presents the emergency department complaining of contacts in both eyes.  Patient states that she thinks that she either rubbed some pollen or scratched her eye while trying to take out her right contact.  After that she was unable to remove either contact.  No blurred vision, double vision or vision changes.  Patient has had no drainage out of the eye that she has some slight increase in tearing.  No other complaints at this time.     Physical Exam   Triage Vital Signs: ED Triage Vitals  Enc Vitals Group     BP 06/06/23 2207 118/71     Pulse Rate 06/06/23 2207 68     Resp 06/06/23 2207 18     Temp 06/06/23 2207 98.5 F (36.9 C)     Temp src --      SpO2 06/06/23 2207 100 %     Weight 06/06/23 2209 190 lb (86.2 kg)     Height 06/06/23 2209 5' 1.5" (1.562 m)     Head Circumference --      Peak Flow --      Pain Score 06/06/23 2208 7     Pain Loc --      Pain Edu? --      Excl. in [place]? --     Most recent vital signs: Vitals:   06/06/23 2207  BP: 118/71  Pulse: 68  Resp: 18  Temp: 98.5 F (36.9 C)  SpO2: 100%     General: Alert and in no acute distress. Eyes:  PERRL. EOMI. contacts in place both eyes.  Both eyes are anesthetized using tetracaine.  Using a cotton-tipped applicator both contacts are successfully removed.  Tetracaine applied to fluorescein strip and this is applied to both eyes.  She has a small area of uptake over the right eye which appears to be more consistent with abrasion and ulceration.  This would be consistent with patient's story.  No uptake of the left eye. Cardiovascular:  Good peripheral perfusion Respiratory: Normal respiratory effort without tachypnea or retractions. Lungs CTAB.  Musculoskeletal: Full range of motion to all extremities.  Neurologic:  No gross focal  neurologic deficits are appreciated.  Skin:   No rash noted Other:   ED Results / Procedures / Treatments   Labs (all labs ordered are listed, but only abnormal results are displayed) Labs Reviewed - No data to display   EKG     RADIOLOGY    No results found.  PROCEDURES:  Critical Care performed: No  Procedures   MEDICATIONS ORDERED IN ED: Medications  tetracaine (PONTOCAINE) 0.5 % ophthalmic solution 2 drop (has no administration in time range)  fluorescein ophthalmic strip 1 strip (has no administration in time range)  ciprofloxacin (CILOXAN) 0.3 % ophthalmic solution 2 drop (has no administration in time range)     IMPRESSION / MDM / ASSESSMENT AND PLAN / ED COURSE  I reviewed the triage vital signs and the nursing notes.                                 Differential diagnosis includes, but is not limited to, conjunctivitis, retained foreign body, corneal abrasion, corneal ulcer   Patient's presentation is most consistent with acute presentation with potential  threat to life or bodily function.   Patient's diagnosis is consistent with corneal abrasion right eye.  Patient presents emergency department after believing she scraped her right eye.  She was trying to have a contact, felt like she scraped/rub something into her eye and this process.  Since then she has been unable to remove her contacts on either side.  No purulent drainage.  No visual changes.  Patient has contact successfully removed.  Patient is instructed to use use glasses for the next week.  Patient has what appears to be a corneal abrasion to the right eye.  Did consider ulcer but given the appearance, but the description of what occurred feel that this is more likely an abrasion.  As she is a contact wearer Cipro drops will be started.  Acular for symptom relief.  Concerning signs and symptoms and return precautions discussed with the patient.  Otherwise follow-up with ophthalmology as needed..   Patient is given ED precautions to return to the ED for any worsening or new symptoms.     FINAL CLINICAL IMPRESSION(S) / ED DIAGNOSES   Final diagnoses:  Abrasion of right cornea, initial encounter     Rx / DC Orders   ED Discharge Orders          Ordered    ciprofloxacin (CILOXAN) 0.3 % ophthalmic solution  Every 2 hours        06/06/23 2315    ketorolac (ACULAR) 0.5 % ophthalmic solution  4 times daily        06/06/23 2315             Note:  This document was prepared using Dragon voice recognition software and may include unintentional dictation errors.   Lanette Hampshire 06/06/23 2319    Jene Every, MD 06/07/23 [phone]

## 2024-10-05 ENCOUNTER — Emergency Department
Admission: EM | Admit: 2024-10-05 | Discharge: 2024-10-05 | Disposition: A | Discharge status: Home / Self Care | Attending: Emergency Medicine | Admitting: Emergency Medicine

## 2024-10-05 ENCOUNTER — Emergency Department

## 2024-10-05 ENCOUNTER — Other Ambulatory Visit: Payer: Self-pay

## 2024-10-05 DIAGNOSIS — M25532 Pain in left wrist: Secondary | ICD-10-CM | POA: Diagnosis not present

## 2024-10-05 DIAGNOSIS — S39012A Strain of muscle, fascia and tendon of lower back, initial encounter: Secondary | ICD-10-CM | POA: Diagnosis not present

## 2024-10-05 DIAGNOSIS — Y92410 Unspecified street and highway as the place of occurrence of the external cause: Secondary | ICD-10-CM | POA: Insufficient documentation

## 2024-10-05 DIAGNOSIS — M940 Chondrocostal junction syndrome [Tietze]: Secondary | ICD-10-CM | POA: Insufficient documentation

## 2024-10-05 DIAGNOSIS — S3992XA Unspecified injury of lower back, initial encounter: Secondary | ICD-10-CM | POA: Diagnosis present

## 2024-10-05 MED ORDER — CYCLOBENZAPRINE HCL 10 MG PO TABS
5.0000 mg | ORAL_TABLET | Freq: Once | ORAL | Status: AC
  Administered 2024-10-05: 5 mg via ORAL
  Filled 2024-10-05: qty 1

## 2024-10-05 MED ORDER — LIDOCAINE 5 % EX PTCH
1.0000 | MEDICATED_PATCH | CUTANEOUS | Status: DC
  Administered 2024-10-05: 1 via TRANSDERMAL
  Filled 2024-10-05: qty 1

## 2024-10-05 MED ORDER — KETOROLAC TROMETHAMINE 30 MG/ML IJ SOLN
30.0000 mg | Freq: Once | INTRAMUSCULAR | Status: AC
  Administered 2024-10-05: 30 mg via INTRAMUSCULAR
  Filled 2024-10-05: qty 1
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
